# Patient Record
Sex: Female | Born: 1950 | Race: Black or African American | Hispanic: No | Marital: Single | Smoking: Never smoker
Health system: Southern US, Community
[De-identification: ages and names within clinical notes are randomized; demographics above are authoritative.]

## PROBLEM LIST (undated history)

## (undated) DIAGNOSIS — I350 Nonrheumatic aortic (valve) stenosis: Secondary | ICD-10-CM

## (undated) DIAGNOSIS — N183 Chronic kidney disease, stage 3 unspecified: Secondary | ICD-10-CM

## (undated) DIAGNOSIS — M8580 Other specified disorders of bone density and structure, unspecified site: Secondary | ICD-10-CM

## (undated) DIAGNOSIS — I10 Essential (primary) hypertension: Secondary | ICD-10-CM

## (undated) DIAGNOSIS — Z789 Other specified health status: Secondary | ICD-10-CM

## (undated) DIAGNOSIS — E785 Hyperlipidemia, unspecified: Secondary | ICD-10-CM

---

## 2021-03-26 DIAGNOSIS — I10 Essential (primary) hypertension: Secondary | ICD-10-CM | POA: Diagnosis present

## 2021-03-26 DIAGNOSIS — R7303 Prediabetes: Secondary | ICD-10-CM | POA: Insufficient documentation

## 2021-03-26 DIAGNOSIS — E785 Hyperlipidemia, unspecified: Secondary | ICD-10-CM | POA: Diagnosis present

## 2021-03-26 DIAGNOSIS — N1831 Chronic kidney disease, stage 3a: Secondary | ICD-10-CM | POA: Diagnosis present

## 2021-06-28 DIAGNOSIS — L309 Dermatitis, unspecified: Secondary | ICD-10-CM | POA: Insufficient documentation

## 2021-12-21 DIAGNOSIS — R011 Cardiac murmur, unspecified: Secondary | ICD-10-CM

## 2021-12-21 HISTORY — DX: Cardiac murmur, unspecified: R01.1

## 2021-12-23 ENCOUNTER — Other Ambulatory Visit: Payer: Self-pay | Admitting: Family Medicine

## 2021-12-23 DIAGNOSIS — Z780 Asymptomatic menopausal state: Secondary | ICD-10-CM

## 2021-12-23 DIAGNOSIS — Z13820 Encounter for screening for osteoporosis: Secondary | ICD-10-CM

## 2021-12-23 DIAGNOSIS — Z1231 Encounter for screening mammogram for malignant neoplasm of breast: Secondary | ICD-10-CM

## 2021-12-31 ENCOUNTER — Other Ambulatory Visit (HOSPITAL_COMMUNITY): Payer: Self-pay | Admitting: Family Medicine

## 2021-12-31 DIAGNOSIS — I10 Essential (primary) hypertension: Secondary | ICD-10-CM

## 2021-12-31 DIAGNOSIS — R011 Cardiac murmur, unspecified: Secondary | ICD-10-CM

## 2022-01-13 ENCOUNTER — Ambulatory Visit (HOSPITAL_COMMUNITY): Payer: Medicare Other | Attending: Cardiology

## 2022-01-13 DIAGNOSIS — I10 Essential (primary) hypertension: Secondary | ICD-10-CM | POA: Insufficient documentation

## 2022-01-13 DIAGNOSIS — R011 Cardiac murmur, unspecified: Secondary | ICD-10-CM | POA: Diagnosis present

## 2022-01-13 LAB — ECHOCARDIOGRAM COMPLETE
AR max vel: 0.88 cm2
AV Area VTI: 0.9 cm2
AV Area mean vel: 0.88 cm2
AV Mean grad: 34 mmHg
AV Peak grad: 51.9 mmHg
Ao pk vel: 3.6 m/s
Area-P 1/2: 2.54 cm2
P 1/2 time: 487 msec
S' Lateral: 2.3 cm

## 2022-01-14 DIAGNOSIS — I350 Nonrheumatic aortic (valve) stenosis: Secondary | ICD-10-CM

## 2022-01-18 ENCOUNTER — Ambulatory Visit
Admission: RE | Admit: 2022-01-18 | Discharge: 2022-01-18 | Disposition: A | Discharge status: Home / Self Care | Payer: Medicare Other | Source: Ambulatory Visit | Attending: Family Medicine | Admitting: Family Medicine

## 2022-01-18 DIAGNOSIS — Z13820 Encounter for screening for osteoporosis: Secondary | ICD-10-CM

## 2022-01-18 DIAGNOSIS — Z780 Asymptomatic menopausal state: Secondary | ICD-10-CM

## 2022-01-20 ENCOUNTER — Telehealth: Payer: Self-pay

## 2022-01-20 NOTE — Telephone Encounter (Signed)
NOTES SCANNED TO REFERRAL 

## 2022-01-24 NOTE — Progress Notes (Signed)
?Cardiology Office Note:   ? ?Date:  01/24/2022  ? ?ID:  Jeanette Martinez, DOB 1951-07-26, MRN [id number] ? ?PCP:  Lula Olszewski, MD ?  ?[facility] Providers ?Cardiologist:  None    ? ?Referring MD: Lula Olszewski, MD  ? ?No chief complaint on file. ?Severe AS ? ?History of Present Illness:   ? ?Jeanette Martinez is a 71 y.o. female with a hx of HTN, HLD, preDM, referral for murmur RUSB ? ?She was seen by her PCP. Noted to have RUSB murmur. Echo showed severe AS suspicious of  paradoxical low flow/low gradient AS. ? ?Her echo showed normal EF , AVA 0.9. mean gradient of 34 mmhg. SV 65; SVI  33  ? ?She notes numbness in her hands periodically. She is asymptomatic otherwise. She  rides her bike and goes up to 5 miles. No syncope. Blood pressures  147/80 , can be variable 116s. She is  retired but very active. Her daughter is here with her. She is a non smoker.   No prior cardiac history.   ? ? ?Current Medications: ?No outpatient medications have been marked as taking for the 01/25/22 encounter (Appointment) with Maisie Fus, MD.  ?  ? ?Allergies:   Patient has no allergy information on record.  ? ?Social History  ? ?Socioeconomic History  ? Marital status: Single  ?  Spouse name: Not on file  ? Number of children: Not on file  ? Years of education: Not on file  ? Highest education level: Not on file  ?Occupational History  ? Not on file  ?Tobacco Use  ? Smoking status: Not on file  ? Smokeless tobacco: Not on file  ?Substance and Sexual Activity  ? Alcohol use: Not on file  ? Drug use: Not on file  ? Sexual activity: Not on file  ?Other Topics Concern  ? Not on file  ?Social History Narrative  ? Not on file  ? ?Social Determinants of Health  ? ?Financial Resource Strain: Not on file  ?Food Insecurity: Not on file  ?Transportation Needs: Not on file  ?Physical Activity: Not on file  ?Stress: Not on file  ?Social Connections: Not on file  ?  ? ?Family History: ?The patient's no Premtaure CAD ? ?ROS:   ?Please see the  history of present illness.    ? All other systems reviewed and are negative. ? ?EKGs/Labs/Other Studies Reviewed:   ? ?The following studies were reviewed today: ? ? ?EKG:  EKG is  ordered today.  The ekg ordered today demonstrates  ? ?NSR, LVH, septal Q ? ?Recent Labs: ?No results found for requested labs within last 8760 hours.  ?Recent Lipid Panel ?No results found for: CHOL, TRIG, HDL, CHOLHDL, VLDL, LDLCALC, LDLDIRECT ? ? ?Risk Assessment/Calculations:   ?  ? ?    ? ?Physical Exam:   ? ?VS:   ?Vitals:  ? 01/25/22 0846  ?BP: 140/80  ?Pulse: 70  ?SpO2: 97%  ? ? ? ?Wt Readings from Last 3 Encounters:  ?No data found for Wt  ?  ? ?GEN:  Well nourished, well developed in no acute distress ?HEENT: Normal ?NECK: No JVD; No carotid bruits ?LYMPHATICS: No lymphadenopathy ?CARDIAC: RRR, 3/6 SEM RUSB, normal carotid upstroke, norubs, gallops ?RESPIRATORY:  Clear to auscultation without rales, wheezing or rhonchi  ?ABDOMEN: Soft, non-tender, non-distended ?MUSCULOSKELETAL:  No edema; No deformity  ?SKIN: Warm and dry ?NEUROLOGIC:  Alert and oriented x 3 ?PSYCHIATRIC:  Normal affect  ? ?ASSESSMENT:   ? ?  Paradoxical low flow/low gradient severe AS: Has normal EF with low SVI. AVA is <1. She is currently asymptomatic. Plan to re-assess for symptoms. We discussed if she develops LH, dizziness, or syncope to let me know. At which time will plan for a cardiac CT and referral to the structural heart team for consideration of TAVI.   ? ?PLAN:   ? ?In order of problems listed above: ? ?Follow up 3 months ? ?   ? ?   ? ? ?Medication Adjustments/Labs and Tests Ordered: ?Current medicines are reviewed at length with the patient today.  Concerns regarding medicines are outlined above.  ?No orders of the defined types were placed in this encounter. ? ?No orders of the defined types were placed in this encounter. ? ? ?There are no Patient Instructions on file for this visit.  ? ?Signed, ?Maisie Fus, MD  ?01/24/2022 2:37 PM    ?[facility] ?

## 2022-01-25 ENCOUNTER — Encounter: Payer: Self-pay | Admitting: Internal Medicine

## 2022-01-25 ENCOUNTER — Ambulatory Visit (INDEPENDENT_AMBULATORY_CARE_PROVIDER_SITE_OTHER): Payer: Medicare Other | Admitting: Internal Medicine

## 2022-01-25 VITALS — BP 140/80 | HR 70 | Ht 63.5 in | Wt 182.0 lb

## 2022-01-25 DIAGNOSIS — I350 Nonrheumatic aortic (valve) stenosis: Secondary | ICD-10-CM

## 2022-01-25 NOTE — Patient Instructions (Addendum)
Glad you are doing well. The plan for your aortic stenosis is to look out for symptoms of light headedness, dizziness or feeling faint. Please call and let us know if this occurs. If so will plan for further evaluation with a CT scan. Otherwise, enjoy your summer ! ? ? ?Medication Instructions:  ?Your physician recommends that you continue on your current medications as directed. Please refer to the Current Medication list given to you today. ? ?*If you need a refill on your cardiac medications before your next appointment, please call your pharmacy* ? ?Follow-Up: ?At [facility], you and your health needs are our priority.  As part of our continuing mission to provide you with exceptional heart care, we have created designated Provider Care Teams.  These Care Teams include your primary Cardiologist (physician) and Advanced Practice Providers (APPs -  Physician Assistants and Nurse Practitioners) who all work together to provide you with the care you need, when you need it. ? ?We recommend signing up for the patient portal called "MyChart".  Sign up information is provided on this After Visit Summary.  MyChart is used to connect with patients for Virtual Visits (Telemedicine).  Patients are able to view lab/test results, encounter notes, upcoming appointments, etc.  Non-urgent messages can be sent to your provider as well.   ?To learn more about what you can do with MyChart, go to [email or web address].   ? ?Your next appointment:   ?3 month(s) ? ?The format for your next appointment:   ?In Person ? ?Provider:   ?Dr. Wyline Mood  ? ?Important Information About Sugar ? ? ? ? ? ? ?

## 2022-04-28 ENCOUNTER — Encounter: Payer: Self-pay | Admitting: Internal Medicine

## 2022-04-28 ENCOUNTER — Ambulatory Visit (INDEPENDENT_AMBULATORY_CARE_PROVIDER_SITE_OTHER): Payer: Medicare Other | Admitting: Internal Medicine

## 2022-04-28 VITALS — BP 162/84 | HR 72 | Ht 63.5 in | Wt 183.2 lb

## 2022-04-28 DIAGNOSIS — I350 Nonrheumatic aortic (valve) stenosis: Secondary | ICD-10-CM | POA: Diagnosis not present

## 2022-04-28 NOTE — Patient Instructions (Signed)
Medication Instructions:  Your physician recommends that you continue on your current medications as directed. Please refer to the Current Medication list given to you today.  *If you need a refill on your cardiac medications before your next appointment, please call your pharmacy*   Follow-Up: At [facility], you and your health needs are our priority.  As part of our continuing mission to provide you with exceptional heart care, we have created designated Provider Care Teams.  These Care Teams include your primary Cardiologist (physician) and Advanced Practice Providers (APPs -  Physician Assistants and Nurse Practitioners) who all work together to provide you with the care you need, when you need it.  We recommend signing up for the patient portal called "MyChart".  Sign up information is provided on this After Visit Summary.  MyChart is used to connect with patients for Virtual Visits (Telemedicine).  Patients are able to view lab/test results, encounter notes, upcoming appointments, etc.  Non-urgent messages can be sent to your provider as well.   To learn more about what you can do with MyChart, go to [email or web address].    Your next appointment:   6 month(s)  The format for your next appointment:   In Person  Provider:   Carolan Clines, MD

## 2022-04-28 NOTE — Progress Notes (Signed)
Cardiology Office Note:    Date:  04/28/2022   ID:  Jeanette Martinez, DOB 1951/07/24, MRN [id number]  PCP:  Jeanette Olszewski, MD   [facility] Providers Cardiologist:  None     Referring MD: Jeanette Olszewski, MD   No chief complaint on file. Severe AS  History of Present Illness:    Jeanette Martinez is a 71 y.o. female with a hx of HTN, HLD, preDM, referral for murmur RUSB  She was seen by her PCP. Noted to have RUSB murmur. Echo showed severe AS suspicious of  paradoxical low flow/low gradient AS.  Her echo showed normal EF , AVA 0.9. mean gradient of 34 mmhg. SV 65; SVI  33   She notes numbness in her hands periodically. She is asymptomatic otherwise. She  rides her bike and goes up to 5 miles. No syncope. Blood pressures  147/80 , can be variable 116s. She is  retired but very active. Her daughter is here with her. She is a non smoker.   No prior cardiac history.    Interim Hx 19/10 Jeanette Martinez comes in today. She feels well today. She is asymptomatic. She saw her PCP recently.  She is still doing her exercises. She checks her blood pressures at home. It fluctuates. Her Bps at home ate 1teens to 130s SBP.   Current Medications: Current Meds  Medication Sig   atenolol (TENORMIN) 25 MG tablet Take 25 mg by mouth daily.   atorvastatin (LIPITOR) 10 MG tablet Take 10 mg by mouth daily.   lisinopril-hydrochlorothiazide (ZESTORETIC) 20-25 MG tablet    Triamcinolone Acetonide (TRIAMCINOLONE 0.1 % CREAM : EUCERIN) CREA      Allergies:   Patient has no allergy information on record.   Social History   Socioeconomic History   Marital status: Single    Spouse name: Not on file   Number of children: Not on file   Years of education: Not on file   Highest education level: Not on file  Occupational History   Not on file  Tobacco Use   Smoking status: Never   Smokeless tobacco: Never  Substance and Sexual Activity   Alcohol use: Not Currently   Drug use: Never   Sexual  activity: Not Currently  Other Topics Concern   Not on file  Social History Narrative   Not on file   Social Determinants of Health   Financial Resource Strain: Not on file  Food Insecurity: Not on file  Transportation Needs: Not on file  Physical Activity: Not on file  Stress: Not on file  Social Connections: Not on file     Family History: The patient's no Premtaure CAD  ROS:   Please see the history of present illness.     All other systems reviewed and are negative.  EKGs/Labs/Other Studies Reviewed:    The following studies were reviewed today:   EKG:  EKG is  ordered today.  The ekg ordered today demonstrates   NSR, LVH, septal Q  Recent Labs: No results found for requested labs within last 365 days.  Recent Lipid Panel No results found for: "CHOL", "TRIG", "HDL", "CHOLHDL", "VLDL", "LDLCALC", "LDLDIRECT"   Risk Assessment/Calculations:           Physical Exam:    VS:   Vitals:   04/28/22 0901  BP: (!) 162/84  Pulse: 72  SpO2: 99%      Wt Readings from Last 3 Encounters:  04/28/22 183 lb 3.2 oz (83.1 kg)  01/25/22 182  lb (82.6 kg)     GEN:  Well nourished, well developed in no acute distress HEENT: Normal NECK: No JVD; No carotid bruits LYMPHATICS: No lymphadenopathy CARDIAC: RRR, 3/6 SEM RUSB, normal carotid upstroke, norubs, gallops RESPIRATORY:  Clear to auscultation without rales, wheezing or rhonchi  ABDOMEN: Soft, non-tender, non-distended MUSCULOSKELETAL:  No edema; No deformity  SKIN: Warm and dry NEUROLOGIC:  Alert and oriented x 3 PSYCHIATRIC:  Normal affect   ASSESSMENT:    Paradoxical low flow/low gradient severe AS: Has normal EF with low SVI. AVA is <1. She is currently asymptomatic. Plan to re-assess for symptoms. We discussed if she develops LH, dizziness, or syncope to let me know. At which time will plan for a cardiac CT and referral to the structural heart team for consideration of TAVI.  No changes today  HTN: high  today. Typically in good control. We discussed if starting to trend upwards to let us know. Would change to arb/norvasc  PLAN:    In order of problems listed above:  Follow up 6 months         Medication Adjustments/Labs and Tests Ordered: Current medicines are reviewed at length with the patient today.  Concerns regarding medicines are outlined above.  No orders of the defined types were placed in this encounter.  No orders of the defined types were placed in this encounter.      Signed, Maisie Fus, MD  04/28/2022 9:40 AM    [facility]

## 2022-11-01 ENCOUNTER — Ambulatory Visit: Payer: Medicare Other | Attending: Internal Medicine | Admitting: Internal Medicine

## 2022-11-01 ENCOUNTER — Encounter: Payer: Self-pay | Admitting: Internal Medicine

## 2022-11-01 VITALS — BP 138/70 | HR 76 | Ht 64.0 in | Wt 181.6 lb

## 2022-11-01 DIAGNOSIS — I350 Nonrheumatic aortic (valve) stenosis: Secondary | ICD-10-CM | POA: Diagnosis not present

## 2022-11-01 NOTE — Patient Instructions (Signed)
    Follow-Up: At [facility], you and your health needs are our priority.  As part of our continuing mission to provide you with exceptional heart care, we have created designated Provider Care Teams.  These Care Teams include your primary Cardiologist (physician) and Advanced Practice Providers (APPs -  Physician Assistants and Nurse Practitioners) who all work together to provide you with the care you need, when you need it.  We recommend signing up for the patient portal called "MyChart".  Sign up information is provided on this After Visit Summary.  MyChart is used to connect with patients for Virtual Visits (Telemedicine).  Patients are able to view lab/test results, encounter notes, upcoming appointments, etc.  Non-urgent messages can be sent to your provider as well.   To learn more about what you can do with MyChart, go to [email or web address].    Your next appointment:   12 month(s)  Provider:   Phineas Inches MD

## 2022-11-01 NOTE — Progress Notes (Signed)
Cardiology Office Note:    Date:  11/01/2022   ID:  Jeanette Martinez, DOB Apr 09, 1951, MRN [id number]  PCP:  Jeanette Sidle, MD   [facility] Providers Cardiologist:  None     Referring MD: Jeanette Sidle, MD   No chief complaint on file. Severe AS  History of Present Illness:    Jeanette Martinez is a 72 y.o. female with a hx of HTN, HLD, preDM, referral for murmur RUSB  She was seen by her PCP. Noted to have RUSB murmur. Echo showed severe AS suspicious of  paradoxical low flow/low gradient AS.  Her echo showed normal EF , AVA 0.9. mean gradient of 34 mmhg. SV 65; SVI  33   She notes numbness in her hands periodically. She is asymptomatic otherwise. She  rides her bike and goes up to 5 miles. No syncope. Blood pressures  147/80 , can be variable 116s. She is  retired but very active. Her daughter is here with her. She is a non smoker.   No prior cardiac history.    Interim Hx 69/10 Jeanette Martinez comes in today. She feels well today. She is asymptomatic. She saw her PCP recently.  She is still doing her exercises. She checks her blood pressures at home. It fluctuates. Her Bps at home ate 1teens to 130s SBP.   Interim Hx 11/01/2022 BP in a good range today. She is doing well. She is doing exercises. She is doing cardio/ riding a bicycle. She is planning to lift weights  Current Medications: Current Outpatient Medications on File Prior to Visit  Medication Sig Dispense Refill   atenolol (TENORMIN) 25 MG tablet Take 25 mg by mouth daily.     atorvastatin (LIPITOR) 10 MG tablet Take 10 mg by mouth daily.     lisinopril-hydrochlorothiazide (ZESTORETIC) 20-25 MG tablet      Multiple Vitamin (MULTIVITAMIN) tablet Take 1 tablet by mouth daily.     Triamcinolone Acetonide (TRIAMCINOLONE 0.1 % CREAM : EUCERIN) CREA      No current facility-administered medications on file prior to visit.     Allergies:   Patient has no known allergies.   Social History   Socioeconomic History    Marital status: Single    Spouse name: Not on file   Number of children: Not on file   Years of education: Not on file   Highest education level: Not on file  Occupational History   Not on file  Tobacco Use   Smoking status: Never   Smokeless tobacco: Never  Substance and Sexual Activity   Alcohol use: Not Currently   Drug use: Never   Sexual activity: Not Currently  Other Topics Concern   Not on file  Social History Narrative   Not on file   Social Determinants of Health   Financial Resource Strain: Not on file  Food Insecurity: Not on file  Transportation Needs: Not on file  Physical Activity: Not on file  Stress: Not on file  Social Connections: Not on file     Family History: The patient's no Premtaure CAD  ROS:   Please see the history of present illness.     All other systems reviewed and are negative.  EKGs/Labs/Other Studies Reviewed:    The following studies were reviewed today:   EKG:  EKG is  ordered today.  The ekg ordered today demonstrates   NSR, LVH, septal Q  Recent Labs: No results found for requested labs within last 365 days.  Recent Lipid Panel  No results found for: "CHOL", "TRIG", "HDL", "CHOLHDL", "VLDL", "LDLCALC", "LDLDIRECT"   Risk Assessment/Calculations:           Physical Exam:    VS:   Vitals:   11/01/22 0920  BP: 138/70  Pulse: 76     Wt Readings from Last 3 Encounters:  11/01/22 181 lb 9.6 oz (82.4 kg)  04/28/22 183 lb 3.2 oz (83.1 kg)  01/25/22 182 lb (82.6 kg)     GEN:  Well nourished, well developed in no acute distress HEENT: Normal NECK: No JVD; No carotid bruits LYMPHATICS: No lymphadenopathy CARDIAC: RRR, 3/6 SEM RUSB, radiates to the carotid ,no rubs, gallops RESPIRATORY:  Clear to auscultation without rales, wheezing or rhonchi  ABDOMEN: Soft, non-tender, non-distended MUSCULOSKELETAL:  No edema; No deformity  SKIN: Warm and dry NEUROLOGIC:  Alert and oriented x 3 PSYCHIATRIC:  Normal affect    ASSESSMENT:    Paradoxical low flow/low gradient severe AS: Has normal EF with low SVI. AVA is <1. She is currently asymptomatic. Plan to re-assess for symptoms. We discussed if she develops LH, dizziness, or syncope to let me know. At which time will plan for a cardiac CT and referral to the structural heart team for consideration of TAVI.    HTN: high today. Typically in good control. We discussed if starting to trend upwards to let us know. Would change to arb/norvasc  PLAN:    In order of problems listed above:  Follow up 12 months         Medication Adjustments/Labs and Tests Ordered: Current medicines are reviewed at length with the patient today.  Concerns regarding medicines are outlined above.  Orders Placed This Encounter  Procedures   EKG 12-Lead   No orders of the defined types were placed in this encounter.      Signed, Janina Mayo, MD  11/01/2022 9:35 AM    [facility]

## 2023-10-26 NOTE — Progress Notes (Signed)
 Cardiology Office Note:    Date:  10/26/2023   ID:  Jeanette Martinez , DOB 06-14-1951, MRN [id number]  PCP:  Jeanette Llano, MD   [facility] Providers Cardiologist:  Jeanette Ronal BRAVO, MD     Referring MD: Jeanette Llano, MD   No chief complaint on file. Severe AS  History of Present Illness:    Jeanette Martinez  is a 73 y.o. female with a hx of HTN, HLD, preDM, referral for murmur RUSB  She was seen by her PCP. Noted to have RUSB murmur. Echo showed severe AS suspicious of  paradoxical low flow/low gradient AS.  Her echo showed normal EF , AVA 0.9. mean gradient of 34 mmhg. SV 65; SVI  33   She notes numbness in her hands periodically. She is asymptomatic otherwise. She  rides her bike and goes up to 5 miles. No syncope. Blood pressures  147/80 , can be variable 116s. She is  retired but very active. Her daughter is here with her. She is a non smoker.   No prior cardiac history.    Interim Hx 64/10 Jeanette Martinez  comes in today. She feels well today. She is asymptomatic. She saw her PCP recently.  She is still doing her exercises. She checks her blood pressures at home. It fluctuates. Her Bps at home ate 1teens to 130s SBP.   Interim Hx 11/01/2022 BP in a good range today. She is doing well. She is doing exercises. She is doing cardio/ riding a bicycle. She is planning to lift weights  Interim hx 10/27/2023 Jeanette Martinez  is here for follow-up. She denies SOB or CP. She is busy with her grandkids.   Current Medications: Current Outpatient Medications on File Prior to Visit  Medication Sig Dispense Refill   atenolol (TENORMIN) 25 MG tablet Take 25 mg by mouth daily.     atorvastatin (LIPITOR) 10 MG tablet Take 10 mg by mouth daily.     lisinopril-hydrochlorothiazide (ZESTORETIC) 20-25 MG tablet      Multiple Vitamin (MULTIVITAMIN) tablet Take 1 tablet by mouth daily.     Triamcinolone Acetonide (TRIAMCINOLONE 0.1 % CREAM : EUCERIN) CREA      No current facility-administered  medications on file prior to visit.     Allergies:   Patient has no known allergies.   Social History   Socioeconomic History   Marital status: Single    Spouse name: Not on file   Number of children: Not on file   Years of education: Not on file   Highest education level: Not on file  Occupational History   Not on file  Tobacco Use   Smoking status: Never   Smokeless tobacco: Never  Substance and Sexual Activity   Alcohol use: Not Currently   Drug use: Never   Sexual activity: Not Currently  Other Topics Concern   Not on file  Social History Narrative   Not on file   Social Drivers of Health   Financial Resource Strain: Not on file  Food Insecurity: Not on file  Transportation Needs: Not on file  Physical Activity: Not on file  Stress: Not on file  Social Connections: Unknown (02/01/2022)   [facility]   Social Network    Social Network: Not on file     Family History: The patient's no Premtaure CAD  ROS:   Please see the history of present illness.     All other systems reviewed and are negative.  EKGs/Labs/Other Studies Reviewed:  The following studies were reviewed today:   EKG:  EKG is  ordered today.  The ekg ordered today demonstrates   NSR, LVH, septal Q -Unchanged likely lead placement   Recent Labs: No results found for requested labs within last 365 days.  Recent Lipid Panel No results found for: CHOL, TRIG, HDL, CHOLHDL, VLDL, LDLCALC, LDLDIRECT   Risk Assessment/Calculations:           Physical Exam:    VS:   Vitals:   10/27/23 0909  BP: 130/62  Pulse: 63  SpO2: 96%    Wt Readings from Last 3 Encounters:  11/01/22 181 lb 9.6 oz (82.4 kg)  04/28/22 183 lb 3.2 oz (83.1 kg)  01/25/22 182 lb (82.6 kg)     GEN:  Well nourished, well developed in no acute distress HEENT: Normal NECK: No JVD CARDIAC: RRR, 3/6 SEM RUSB, radiates to the carotid ,no rubs, gallops RESPIRATORY:   Clear to auscultation without rales, wheezing or rhonchi  ABDOMEN: Soft, non-tender, non-distended MUSCULOSKELETAL:  No edema; No deformity  SKIN: Warm and dry NEUROLOGIC:  Alert and oriented x 3 PSYCHIATRIC:  Normal affect   ASSESSMENT:    Paradoxical low flow/low gradient severe AS: Has normal EF with low SVI. AVA is <1. She is currently asymptomatic. Plan to re-assess for symptoms. We discussed if she develops LH, dizziness, or syncope to let me know. At which time will plan for a cardiac CT/cath and referral to the structural heart team for consideration of TAVI.    HTN: well controlled. Continue current regimen, she's been on same regimen for many years.  She would prefer holistic approach if possible.  She also worried about her kidney function.  Will get labs today, but we discussed that keeping her blood pressure well-controlled will help protect her kidneys.  PLAN:    In order of problems listed above:  Repeat an echocardiogram BMET Follow up in 6 months with an APP        Medication Adjustments/Labs and Tests Ordered: Current medicines are reviewed at length with the patient today.  Concerns regarding medicines are outlined above.  No orders of the defined types were placed in this encounter.  No orders of the defined types were placed in this encounter.      Signed, Jeanette Ronal BRAVO, MD  10/26/2023 2:52 PM    [facility]

## 2023-10-27 ENCOUNTER — Encounter: Payer: Self-pay | Admitting: Internal Medicine

## 2023-10-27 ENCOUNTER — Ambulatory Visit: Payer: Medicare Other | Attending: Internal Medicine | Admitting: Internal Medicine

## 2023-10-27 VITALS — BP 130/62 | HR 63 | Ht 65.5 in | Wt 183.2 lb

## 2023-10-27 DIAGNOSIS — I350 Nonrheumatic aortic (valve) stenosis: Secondary | ICD-10-CM | POA: Insufficient documentation

## 2023-10-27 LAB — BASIC METABOLIC PANEL
BUN/Creatinine Ratio: 19 (ref 12–28)
BUN: 27 mg/dL (ref 8–27)
CO2: 22 mmol/L (ref 20–29)
Calcium: 9.4 mg/dL (ref 8.7–10.3)
Chloride: 100 mmol/L (ref 96–106)
Creatinine, Ser: 1.4 mg/dL — ABNORMAL HIGH (ref 0.57–1.00)
Glucose: 128 mg/dL — ABNORMAL HIGH (ref 70–99)
Potassium: 4.7 mmol/L (ref 3.5–5.2)
Sodium: 137 mmol/L (ref 134–144)
eGFR: 40 mL/min/{1.73_m2} — ABNORMAL LOW (ref >59)

## 2023-10-27 NOTE — Patient Instructions (Signed)
 Medication Instructions:  No changes  *If you need a refill on your cardiac medications before your next appointment, please call your pharmacy*   Lab Work: BMET If you have labs (blood work) drawn today and your tests are completely normal, you will receive your results only by: MyChart Message (if you have MyChart) OR A paper copy in the mail If you have any lab test that is abnormal or we need to change your treatment, we will call you to review the results.   Testing/Procedures: Echo next available  Your physician has requested that you have an echocardiogram. Echocardiography is a painless test that uses sound waves to create images of your heart. It provides your doctor with information about the size and shape of your heart and how well your heart's chambers and valves are working. This procedure takes approximately one hour. There are no restrictions for this procedure. Please do NOT wear cologne, perfume, aftershave, or lotions (deodorant is allowed). Please arrive 15 minutes prior to your appointment time.  Please note: We ask at that you not bring children with you during ultrasound (echo/ vascular) testing. Due to room size and safety concerns, children are not allowed in the ultrasound rooms during exams. Our front office staff cannot provide observation of children in our lobby area while testing is being conducted. An adult accompanying a patient to their appointment will only be allowed in the ultrasound room at the discretion of the ultrasound technician under special circumstances. We apologize for any inconvenience.    Follow-Up: At [facility], you and your health needs are our priority.  As part of our continuing mission to provide you with exceptional heart care, we have created designated Provider Care Teams.  These Care Teams include your primary Cardiologist (physician) and Advanced Practice Providers (APPs -  Physician Assistants and Nurse Practitioners) who  all work together to provide you with the care you need, when you need it.  We recommend signing up for the patient portal called MyChart.  Sign up information is provided on this After Visit Summary.  MyChart is used to connect with patients for Virtual Visits (Telemedicine).  Patients are able to view lab/test results, encounter notes, upcoming appointments, etc.  Non-urgent messages can be sent to your provider as well.   To learn more about what you can do with MyChart, go to [email or web address].    Your next appointment:   6 month(s)  Provider:   With any APP   Other Instructions

## 2023-11-23 ENCOUNTER — Ambulatory Visit (HOSPITAL_COMMUNITY): Payer: Medicare Other | Attending: Internal Medicine

## 2023-11-23 DIAGNOSIS — I350 Nonrheumatic aortic (valve) stenosis: Secondary | ICD-10-CM | POA: Diagnosis present

## 2023-11-23 LAB — ECHOCARDIOGRAM COMPLETE
AR max vel: 0.98 cm2
AV Area VTI: 1.02 cm2
AV Area mean vel: 0.97 cm2
AV Mean grad: 33.6 mmHg
AV Peak grad: 59 mmHg
Ao pk vel: 3.84 m/s
Area-P 1/2: 3.27 cm2
P 1/2 time: 505 ms
S' Lateral: 2.1 cm

## 2023-11-27 ENCOUNTER — Encounter: Payer: Self-pay | Admitting: *Deleted

## 2024-10-17 NOTE — Progress Notes (Signed)
"   °  °  Cardiology Office Note Date:  10/18/2024  ID:  Jeanette Martinez , DOB [birth year], MRN [id number] PCP:  Rik Glinda DASEN, FNP  Cardiologist:  Joelle VEAR Ren Donley, MD  Chief Complaint  Patient presents with   severe aortic stenosis     Problems Severe AS TTE 3/25: 65-70%, mod LVH, G1DD, mild LAE, Severe AS (3.9 m/sec, MG 34 mmHg, AVA 1.02 cm^2, mod AI M: AE25, AN10, LL-HTZ 20-25  Visits  2/25: Asymptomatic; TTE 1/26: Admit for symptomatic severe AS    Discussed the use of AI scribe software for clinical note transcription with the patient, who gave verbal consent to proceed.  History of Present Illness Jeanette Martinez  is a 74 year old female with severe aortic stenosis who presents with episodes of fainting and dizziness. She is accompanied by her daughter. She was previously under the care of Dr. Alvan for her condition. She has been experiencing episodes of faintness and dizziness over the past week, particularly during exertion such as walking up stairs or moving around in a store. She describes a sensation of a wave coming over her, leading to lightheadedness and the need to sit down and take deep breaths to recover. These episodes have occurred multiple times this week, with the most severe incident resulting in her passing out at a store entrance. No chest pain is reported, but there is a slight shortness of breath accompanying these episodes, especially during physical activity. Her past medical history includes a heart valve issue for which she was being followed by a cardiologist, with a follow-up appointment missed in August. She was due for a follow-up in August but missed the appointment. She has not experienced similar fainting episodes before this week. She is a [employer] and does not accept blood transfusions, which is relevant to her medical management. She has a durable power of attorney and has communicated her wishes regarding medical treatment to her daughter,  who is aware of her medical history and current situation.   ROS: Please see the history of present illness. All other systems are reviewed and negative.    PHYSICAL EXAM: VS:  BP (!) 166/88 (BP Location: Right Arm, Patient Position: Sitting, Cuff Size: Normal)   Pulse 94   Ht 5' 5 (1.651 m)   Wt 183 lb 8 oz (83.2 kg)   SpO2 92%   BMI 30.54 kg/m  , BMI Body mass index is 30.54 kg/m. GEN: Well nourished, well developed, in no acute distress HEENT: normal Neck: no JVD, carotid bruits, or masses Cardiac: RRR with 4/6 SEM loudest in LUSB radiating to carotids; no rubs, or gallops,no edema  Respiratory:  CTAB bilaterally, normal work of breathing GI: soft, nontender, nondistended, + BS Extremities: No LE edema Skin: warm and dry, no rash Neuro:  Strength and sensation are intact  Recent Labs: Reviewed  Studies: Reviewed Assessment & Plan  Severe aortic stenosis with syncope Has had recurrent syncopal events this week and including this morning in the clinic building, concerning for symptomatic severe AS - Admitted to hospital for evaluation and management. - Coordinate with cardiology and surgery for evaluation of open heart surgery versus TAVR. - Inform cardiologist and surgeon of her refusal of blood transfusions and discuss alternatives. - Arrange anesthesiology consultation for perioperative management considering refusal of blood transfusions. - Scheduled follow-up in three months, subject to hospital evaluation outcomes.   Signed, Joelle VEAR Ren Donley, MD  10/18/2024 10:52 AM    [facility] "

## 2024-10-18 ENCOUNTER — Encounter (HOSPITAL_COMMUNITY): Payer: Self-pay

## 2024-10-18 ENCOUNTER — Inpatient Hospital Stay (HOSPITAL_COMMUNITY)
Admission: EM | Admit: 2024-10-18 | Discharge: 2024-10-22 | DRG: 164 | Disposition: A | Source: Home / Self Care | Attending: Internal Medicine | Admitting: Internal Medicine

## 2024-10-18 ENCOUNTER — Emergency Department (HOSPITAL_COMMUNITY)

## 2024-10-18 ENCOUNTER — Ambulatory Visit

## 2024-10-18 ENCOUNTER — Telehealth: Payer: Self-pay

## 2024-10-18 ENCOUNTER — Other Ambulatory Visit: Payer: Self-pay

## 2024-10-18 VITALS — BP 166/88 | HR 94 | Ht 65.0 in | Wt 183.5 lb

## 2024-10-18 DIAGNOSIS — R7989 Other specified abnormal findings of blood chemistry: Secondary | ICD-10-CM

## 2024-10-18 DIAGNOSIS — N1831 Chronic kidney disease, stage 3a: Secondary | ICD-10-CM | POA: Diagnosis not present

## 2024-10-18 DIAGNOSIS — I82401 Acute embolism and thrombosis of unspecified deep veins of right lower extremity: Secondary | ICD-10-CM

## 2024-10-18 DIAGNOSIS — I080 Rheumatic disorders of both mitral and aortic valves: Secondary | ICD-10-CM | POA: Diagnosis present

## 2024-10-18 DIAGNOSIS — I352 Nonrheumatic aortic (valve) stenosis with insufficiency: Secondary | ICD-10-CM | POA: Diagnosis present

## 2024-10-18 DIAGNOSIS — I2699 Other pulmonary embolism without acute cor pulmonale: Principal | ICD-10-CM | POA: Diagnosis present

## 2024-10-18 DIAGNOSIS — M419 Scoliosis, unspecified: Secondary | ICD-10-CM | POA: Diagnosis present

## 2024-10-18 DIAGNOSIS — I10 Essential (primary) hypertension: Secondary | ICD-10-CM | POA: Insufficient documentation

## 2024-10-18 DIAGNOSIS — G4733 Obstructive sleep apnea (adult) (pediatric): Secondary | ICD-10-CM | POA: Diagnosis present

## 2024-10-18 DIAGNOSIS — I700 Atherosclerosis of aorta: Secondary | ICD-10-CM | POA: Diagnosis present

## 2024-10-18 DIAGNOSIS — Z79899 Other long term (current) drug therapy: Secondary | ICD-10-CM

## 2024-10-18 DIAGNOSIS — I350 Nonrheumatic aortic (valve) stenosis: Secondary | ICD-10-CM | POA: Insufficient documentation

## 2024-10-18 DIAGNOSIS — E785 Hyperlipidemia, unspecified: Secondary | ICD-10-CM | POA: Insufficient documentation

## 2024-10-18 DIAGNOSIS — Z531 Procedure and treatment not carried out because of patient's decision for reasons of belief and group pressure: Secondary | ICD-10-CM | POA: Diagnosis not present

## 2024-10-18 DIAGNOSIS — I82451 Acute embolism and thrombosis of right peroneal vein: Secondary | ICD-10-CM | POA: Diagnosis present

## 2024-10-18 DIAGNOSIS — I351 Nonrheumatic aortic (valve) insufficiency: Secondary | ICD-10-CM

## 2024-10-18 DIAGNOSIS — I2724 Chronic thromboembolic pulmonary hypertension: Secondary | ICD-10-CM | POA: Diagnosis present

## 2024-10-18 DIAGNOSIS — I959 Hypotension, unspecified: Secondary | ICD-10-CM | POA: Diagnosis not present

## 2024-10-18 DIAGNOSIS — I1310 Hypertensive heart and chronic kidney disease without heart failure, with stage 1 through stage 4 chronic kidney disease, or unspecified chronic kidney disease: Secondary | ICD-10-CM | POA: Diagnosis present

## 2024-10-18 DIAGNOSIS — R55 Syncope and collapse: Principal | ICD-10-CM

## 2024-10-18 DIAGNOSIS — R918 Other nonspecific abnormal finding of lung field: Secondary | ICD-10-CM | POA: Diagnosis present

## 2024-10-18 HISTORY — DX: Other specified health status: Z78.9

## 2024-10-18 HISTORY — DX: Nonrheumatic aortic (valve) stenosis: I35.0

## 2024-10-18 HISTORY — DX: Other specified disorders of bone density and structure, unspecified site: M85.80

## 2024-10-18 HISTORY — DX: Hyperlipidemia, unspecified: E78.5

## 2024-10-18 HISTORY — DX: Chronic kidney disease, stage 3 unspecified: N18.30

## 2024-10-18 HISTORY — DX: Essential (primary) hypertension: I10

## 2024-10-18 LAB — BASIC METABOLIC PANEL WITH GFR
Anion gap: 14 (ref 5–15)
BUN: 21 mg/dL (ref 8–23)
CO2: 24 mmol/L (ref 22–32)
Calcium: 9.6 mg/dL (ref 8.9–10.3)
Chloride: 98 mmol/L (ref 98–111)
Creatinine, Ser: 1.25 mg/dL — ABNORMAL HIGH (ref 0.44–1.00)
GFR, Estimated: 46 mL/min — ABNORMAL LOW
Glucose, Bld: 128 mg/dL — ABNORMAL HIGH (ref 70–99)
Potassium: 4.8 mmol/L (ref 3.5–5.1)
Sodium: 136 mmol/L (ref 135–145)

## 2024-10-18 LAB — CBC
HCT: 42.5 % (ref 36.0–46.0)
Hemoglobin: 13.6 g/dL (ref 12.0–15.0)
MCH: 29.1 pg (ref 26.0–34.0)
MCHC: 32 g/dL (ref 30.0–36.0)
MCV: 90.8 fL (ref 80.0–100.0)
Platelets: 260 10*3/uL (ref 150–400)
RBC: 4.68 MIL/uL (ref 3.87–5.11)
RDW: 15 % (ref 11.5–15.5)
WBC: 6.1 10*3/uL (ref 4.0–10.5)
nRBC: 0 % (ref 0.0–0.2)

## 2024-10-18 LAB — TROPONIN T, HIGH SENSITIVITY
Troponin T High Sensitivity: 54 ng/L — ABNORMAL HIGH (ref 0–19)
Troponin T High Sensitivity: 50 ng/L — ABNORMAL HIGH (ref 0–19)

## 2024-10-18 MED ORDER — ATORVASTATIN CALCIUM 10 MG PO TABS
10.0000 mg | ORAL_TABLET | Freq: Every day | ORAL | Status: DC
  Administered 2024-10-19 – 2024-10-22 (×3): 10 mg via ORAL
  Filled 2024-10-18 (×3): qty 1

## 2024-10-18 MED ORDER — POLYETHYLENE GLYCOL 3350 17 G PO PACK: 17.0000 g | PACK | Freq: Every day | ORAL | Status: DC | PRN

## 2024-10-18 MED ORDER — ATENOLOL 25 MG PO TABS
25.0000 mg | ORAL_TABLET | Freq: Every day | ORAL | Status: DC
  Administered 2024-10-19: 25 mg via ORAL
  Filled 2024-10-18 (×2): qty 1

## 2024-10-18 MED ORDER — SODIUM CHLORIDE 0.9% FLUSH
3.0000 mL | Freq: Two times a day (BID) | INTRAVENOUS | Status: DC
  Administered 2024-10-19 – 2024-10-22 (×6): 3 mL via INTRAVENOUS

## 2024-10-18 MED ORDER — LISINOPRIL 20 MG PO TABS: 20.0000 mg | ORAL_TABLET | Freq: Every day | ORAL | Status: DC

## 2024-10-18 MED ORDER — ACETAMINOPHEN 325 MG PO TABS: 650.0000 mg | ORAL_TABLET | Freq: Four times a day (QID) | ORAL | Status: DC | PRN

## 2024-10-18 MED ORDER — HYDROCHLOROTHIAZIDE 25 MG PO TABS: 25.0000 mg | ORAL_TABLET | Freq: Every day | ORAL | Status: DC

## 2024-10-18 MED ORDER — LISINOPRIL-HYDROCHLOROTHIAZIDE 20-25 MG PO TABS: 1.0000 | ORAL_TABLET | Freq: Every day | ORAL | Status: DC

## 2024-10-18 MED ORDER — ACETAMINOPHEN 650 MG RE SUPP: 650.0000 mg | Freq: Four times a day (QID) | RECTAL | Status: DC | PRN

## 2024-10-18 NOTE — ED Provider Notes (Cosign Needed Addendum)
" [place] [facility] Provider Note   CSN: [id number] Arrival date & time: 10/18/24  1131     Patient presents with: Shortness of Breath, Dizziness, and Loss of Consciousness   Avalin Martinez  is a 74 y.o. female with a past medical history notable for severe aortic stenosis who presents today for evaluation of syncopal episode.  Patient has an echo from 6 months ago that showed severe aortic stenosis however at that time was largely asymptomatic.  Starting approximately a week ago patient has had worsening exertional lightheadedness and presyncopal episodes without any frank syncope.  Patient describes that she has to climb up a set of stairs going into her house and more recently has had significant lightheadedness and near syncope when doing so.  Was seeing cardiology today and from walking to the clinic from the parking lot became lightheaded and had a syncopal episode that was witnessed by family.  Brief loss of consciousness without any seizure-like activity.  No significant trauma associated with this.  Was seen by cardiology instructed to come to the hospital for further evaluation.  Patient reports that she is asymptomatic at this point in time.  Has no ongoing chest pain, shortness of breath, lightheadedness or dizziness.   Shortness of Breath Associated symptoms: syncope   Dizziness Associated symptoms: shortness of breath and syncope   Loss of Consciousness Associated symptoms: dizziness and shortness of breath        Prior to Admission medications  Medication Sig Start Date End Date Taking? Authorizing Provider  atenolol  (TENORMIN ) 25 MG tablet Take 25 mg by mouth daily. 10/25/21   [provider]  atorvastatin  (LIPITOR) 10 MG tablet Take 10 mg by mouth daily. 10/25/21   [provider]  lisinopril -hydrochlorothiazide  (ZESTORETIC ) 20-25 MG tablet  09/21/11   [provider]  Multiple Vitamin (MULTIVITAMIN) tablet Take  1 tablet by mouth daily.    [provider]    Allergies: Patient has no known allergies.    Review of Systems  Respiratory:  Positive for shortness of breath.   Cardiovascular:  Positive for syncope.  Neurological:  Positive for dizziness.    Updated Vital Signs BP (!) 165/94   Pulse 83   Temp 97.9 F (36.6 C)   Resp 20   Ht 5' 5 (1.651 m)   Wt 83.2 kg   SpO2 94%   BMI 30.54 kg/m   Physical Exam Constitutional:      Appearance: She is well-developed.  HENT:     Head: Normocephalic.     Mouth/Throat:     Mouth: Mucous membranes are moist.  Eyes:     Pupils: Pupils are equal, round, and reactive to light.  Cardiovascular:     Rate and Rhythm: Normal rate and regular rhythm.     Heart sounds: Murmur heard.  Pulmonary:     Effort: Pulmonary effort is normal.     Breath sounds: Normal breath sounds. No decreased breath sounds.  Abdominal:     Palpations: Abdomen is soft.  Musculoskeletal:        General: Normal range of motion.     Cervical back: Normal range of motion.  Skin:    General: Skin is warm.     Capillary Refill: Capillary refill takes less than 2 seconds.  Neurological:     General: No focal deficit present.     Mental Status: She is alert.     Cranial Nerves: No cranial nerve deficit.  Psychiatric:  Mood and Affect: Mood normal.     (all labs ordered are listed, but only abnormal results are displayed) Labs Reviewed  BASIC METABOLIC PANEL WITH GFR - Abnormal; Notable for the following components:      Result Value   Glucose, Bld 128 (*)    Creatinine, Ser 1.25 (*)    GFR, Estimated 46 (*)    All other components within normal limits  TROPONIN T, HIGH SENSITIVITY - Abnormal; Notable for the following components:   Troponin T High Sensitivity 54 (*)    All other components within normal limits  TROPONIN T, HIGH SENSITIVITY - Abnormal; Notable for the following components:   Troponin T High Sensitivity 50 (*)    All other  components within normal limits  CBC    EKG: EKG Interpretation Date/Time:  Friday October 18 2024 12:06:52 EST Ventricular Rate:  83 PR Interval:  148 QRS Duration:  80 QT Interval:  378 QTC Calculation: 444 R Axis:   -7  Text Interpretation: Normal sinus rhythm Septal infarct , age undetermined T wave abnormality, consider anterior ischemia Abnormal ECG When compared with ECG of 27-Oct-2023 09:14, twave inversion in v3 is new from first prior ekg of 27 October 2023 Confirmed by Levander Houston [phone]) on 10/18/2024 2:23:20 PM  Radiology: DG Chest 2 View Result Date: 10/18/2024 CLINICAL DATA:  Chest pain. EXAM: CHEST - 2 VIEW COMPARISON:  None Available. FINDINGS: S-shaped scoliosis in the thoracolumbar spine. Lungs are clear. Heart size is within normal limits. Trachea is midline. No large pleural effusions. Negative for a pneumothorax. IMPRESSION: 1. No active cardiopulmonary disease. 2. Scoliosis. Electronically Signed   By: Juliene Balder M.D.   On: 10/18/2024 12:57    Procedures   Medications Ordered in the ED - No data to display                                Medical Decision Making Amount and/or Complexity of Data Reviewed Labs: ordered. Radiology: ordered.  Risk Decision regarding hospitalization.   Patient is a 74 year old female who presents today for evaluation of syncope in the setting of known severe aortic stenosis.  On initial assessment patient was noted to be hemodynamically stable and afebrile.  On bedside assessment patient was noted be resting comfortably without acute distress.  Patient does have a significant murmur heard on auscultation however otherwise without any acute findings on physical examination.  At the time of my evaluation patient already had laboratory assessment that did not show any acute abnormalities.  Patient did have a slightly elevated troponin at 54 that subsequent downtrended to 50.  I did speak to cardiology regarding this patient given  her known history and they recommended admission at this time.  Will do further evaluation in the inpatient setting and consideration for potential TAVR versus open heart valve repair.  Patient was ultimately mated to the hospital service for further management.  I do feel that her symptoms are related to symptomatic aortic stenosis.  I have lower concerns at this time for ACS.  Unlikely to be pulmonary embolism or acute aortic pathology based on description of symptoms and physical examination.   Final diagnoses:  Syncope and collapse  Aortic valve stenosis, etiology of cardiac valve disease unspecified    ED Discharge Orders     None          Laurita Sieving, MD 10/18/24 JERILYN    Laurita Sieving, MD 10/18/24  1957 ° °"

## 2024-10-18 NOTE — ED Provider Triage Note (Signed)
 Emergency Medicine Provider Triage Evaluation Note  Jeanette Martinez  , Martinez 74 y.o. female  was evaluated in triage.  Pt complains of syncope. Reports that since Monday she has had dyspnea on exertion with lightheadedness. Reports that she went to her audiologist for an appointment and had Martinez syncopal episode walking into the office.  She denies any head strike or any other injuries from this.  Reports that her cardiologist saw her and is concerned about her severe aortic stenosis contributing to her symptoms and sent her here for cardiology evaluation for potential TAVR.  Patient denies ever having chest pain, reports her symptoms are only present with exertion and resolves with rest.  Currently feels asymptomatic.  Review of Systems  Positive:  Negative:   Physical Exam  BP (!) 165/94   Pulse 83   Temp 97.9 F (36.6 C)   Resp 20   Ht 5' 5 (1.651 m)   Wt 83.2 kg   SpO2 94%   BMI 30.54 kg/m  Gen:   Awake, no distress   Resp:  Normal effort  MSK:   Moves extremities without difficulty  Other:  Systolic ejection murmur present  Medical Decision Making  Medically screening exam initiated at 1:00 PM.  Appropriate orders placed.  Jeanette Martinez  was informed that the remainder of the evaluation will be completed by another provider, this initial triage assessment does not replace that evaluation, and the importance of remaining in the ED until their evaluation is complete.  Work-up initiated   Jeanette Lehr A, PA-C 10/18/24 1302

## 2024-10-18 NOTE — Telephone Encounter (Signed)
 Hi. Pt wanted to drop off ppwk showing US  Aorta Screening for AAA with [facility]. I've left it in your mailbox. Thank you.

## 2024-10-18 NOTE — Progress Notes (Signed)
 Patient seen in office today by Dr Ren Ny. Severe AS with multiple pre syncopal events with exertion starting this week No chest pain or palpitations. ECG SR with LVH no AV block. TTE 11/23/23 with severe AS AVA 0.97, mean gradient 33.6 peak 59 mmHg DVI 0.27. Normal EF. Currently stable with no symptoms. 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 Lives independently. Has her two grand daughters in room with her. Discussed not being able to drive until valve issues sorted  No distress Lungs clear  AS murmur mid/late peaking S2 preserved Abdomen benign  No edema  CXR NAD scoliosis  Labs troponin 50 Hct 42.5 K 4.8 Cr 1.25   Will check d dimer to r/o PE Right /Left cath Monday risks discussed Orders done and on board. Hydrate evening before  Update Echo Can try to do TAVR CT Tuesday and get consults with structural done and CVTS while here. Hopefully can complete full w/u and D/c Tuesday   Shared Decision Making/Informed Consent The risks [stroke (1 in 1000), death (1 in 1000), kidney failure [usually temporary] (1 in 500), bleeding (1 in 200), allergic reaction [possibly serious] (1 in 200)], benefits (diagnostic support and management of coronary artery disease) and alternatives of a cardiac catheterization were discussed in detail with Jeanette Martinez and she is willing to proceed.   Jeanette Emmer MD [facility]

## 2024-10-18 NOTE — ED Triage Notes (Signed)
 Pt reports feeling dizzy and short of breath. Pt reports that today when she was coming to got to the cardiologist she had a syncope episode. Pt denies pain. Pt reports that she feels fine now. Pt reports that she has this feeling sometimes when walking upstairs. But it passes. Pt reports a history of aortic stenosis.

## 2024-10-18 NOTE — ED Triage Notes (Signed)
 Pt here with c/o [facility] and dizziness that started on Monday. Reports she made an appt with her cardiologist but passed out in the waiting room. Denies falling or LOC, was able to sit down prior to passing out.

## 2024-10-18 NOTE — Patient Instructions (Addendum)
 Medication Instructions:  NO CHANGES *If you need a refill on your cardiac medications before your next appointment, please call your pharmacy*  Lab Work: NO LABS If you have labs (blood work) drawn today and your tests are completely normal, you will receive your results only by: MyChart Message (if you have MyChart) OR A paper copy in the mail If you have any lab test that is abnormal or we need to change your treatment, we will call you to review the results.  Testing/Procedures: NO TESTING  Follow-Up: At [facility], you and your health needs are our priority.  As part of our continuing mission to provide you with exceptional heart care, our providers are all part of one team.  This team includes your primary Cardiologist (physician) and Advanced Practice Providers or APPs (Physician Assistants and Nurse Practitioners) who all work together to provide you with the care you need, when you need it.  Your next appointment:   FOLLOW UP WILL BE BASED OFF HOSPITAL DISCHARGE    Other Instructions  DR. REN IS RECOMMENDING DIRECT ADMIT TO THE HOSPITAL. PLEASE REPORT TO EMERGENCY ROOM, THEY HAVE BEEN INFORMED THAT YOU ARE COMING

## 2024-10-19 ENCOUNTER — Inpatient Hospital Stay (HOSPITAL_COMMUNITY)

## 2024-10-19 DIAGNOSIS — R7989 Other specified abnormal findings of blood chemistry: Secondary | ICD-10-CM

## 2024-10-19 DIAGNOSIS — I350 Nonrheumatic aortic (valve) stenosis: Secondary | ICD-10-CM

## 2024-10-19 DIAGNOSIS — I10 Essential (primary) hypertension: Secondary | ICD-10-CM | POA: Diagnosis not present

## 2024-10-19 DIAGNOSIS — I2699 Other pulmonary embolism without acute cor pulmonale: Secondary | ICD-10-CM

## 2024-10-19 DIAGNOSIS — R911 Solitary pulmonary nodule: Secondary | ICD-10-CM | POA: Diagnosis not present

## 2024-10-19 DIAGNOSIS — N1831 Chronic kidney disease, stage 3a: Secondary | ICD-10-CM | POA: Diagnosis not present

## 2024-10-19 LAB — COMPREHENSIVE METABOLIC PANEL WITH GFR
ALT: 78 U/L — ABNORMAL HIGH (ref 0–44)
AST: 45 U/L — ABNORMAL HIGH (ref 15–41)
Albumin: 3.6 g/dL (ref 3.5–5.0)
Alkaline Phosphatase: 48 U/L (ref 38–126)
Anion gap: 11 (ref 5–15)
BUN: 23 mg/dL (ref 8–23)
CO2: 26 mmol/L (ref 22–32)
Calcium: 9.1 mg/dL (ref 8.9–10.3)
Chloride: 102 mmol/L (ref 98–111)
Creatinine, Ser: 1.33 mg/dL — ABNORMAL HIGH (ref 0.44–1.00)
GFR, Estimated: 43 mL/min — ABNORMAL LOW
Glucose, Bld: 81 mg/dL (ref 70–99)
Potassium: 4.4 mmol/L (ref 3.5–5.1)
Sodium: 139 mmol/L (ref 135–145)
Total Bilirubin: 0.5 mg/dL (ref 0.0–1.2)
Total Protein: 7 g/dL (ref 6.5–8.1)

## 2024-10-19 LAB — MAGNESIUM: Magnesium: 1.7 mg/dL (ref 1.7–2.4)

## 2024-10-19 LAB — ECHOCARDIOGRAM COMPLETE
AR max vel: 0.73 cm2
AV Area VTI: 0.77 cm2
AV Area mean vel: 0.77 cm2
AV Mean grad: 38 mmHg
AV Peak grad: 63.4 mmHg
Ao pk vel: 3.98 m/s
Area-P 1/2: 3.51 cm2
Height: 65 in
MV VTI: 2.27 cm2
S' Lateral: 2.1 cm
Weight: 2838.4 [oz_av]

## 2024-10-19 LAB — CBC
HCT: 37.2 % (ref 36.0–46.0)
Hemoglobin: 12.2 g/dL (ref 12.0–15.0)
MCH: 29.3 pg (ref 26.0–34.0)
MCHC: 32.8 g/dL (ref 30.0–36.0)
MCV: 89.4 fL (ref 80.0–100.0)
Platelets: 211 10*3/uL (ref 150–400)
RBC: 4.16 MIL/uL (ref 3.87–5.11)
RDW: 15 % (ref 11.5–15.5)
WBC: 6.5 10*3/uL (ref 4.0–10.5)
nRBC: 0 % (ref 0.0–0.2)

## 2024-10-19 LAB — LACTIC ACID, PLASMA
Lactic Acid, Venous: 2.5 mmol/L (ref 0.5–1.9)
Lactic Acid, Venous: 2 mmol/L (ref 0.5–1.9)

## 2024-10-19 LAB — MRSA NEXT GEN BY PCR, NASAL: MRSA by PCR Next Gen: NOT DETECTED

## 2024-10-19 LAB — TROPONIN T, HIGH SENSITIVITY
Troponin T High Sensitivity: 42 ng/L — ABNORMAL HIGH (ref 0–19)
Troponin T High Sensitivity: 36 ng/L — ABNORMAL HIGH (ref 0–19)

## 2024-10-19 LAB — D-DIMER, QUANTITATIVE: D-Dimer, Quant: 18.49 ug{FEU}/mL — ABNORMAL HIGH (ref 0.00–0.50)

## 2024-10-19 LAB — HEPARIN LEVEL (UNFRACTIONATED): Heparin Unfractionated: 0.52 [IU]/mL (ref 0.30–0.70)

## 2024-10-19 LAB — PRO BRAIN NATRIURETIC PEPTIDE: Pro Brain Natriuretic Peptide: 1650 pg/mL — ABNORMAL HIGH

## 2024-10-19 MED ORDER — IOHEXOL 350 MG/ML SOLN
75.0000 mL | Freq: Once | INTRAVENOUS | Status: AC | PRN
  Administered 2024-10-19: 75 mL via INTRAVENOUS

## 2024-10-19 MED ORDER — HEPARIN (PORCINE) 25000 UT/250ML-% IV SOLN
1200.0000 [IU]/h | INTRAVENOUS | Status: DC
  Administered 2024-10-19 – 2024-10-20 (×3): 1200 [IU]/h via INTRAVENOUS
  Filled 2024-10-19 (×3): qty 250

## 2024-10-19 MED ORDER — HEPARIN BOLUS VIA INFUSION
4500.0000 [IU] | Freq: Once | INTRAVENOUS | Status: AC
  Administered 2024-10-19: 4500 [IU] via INTRAVENOUS
  Filled 2024-10-19: qty 4500

## 2024-10-19 MED ORDER — LACTATED RINGERS IV SOLN: INTRAVENOUS | Status: DC

## 2024-10-19 NOTE — Subjective & Objective (Signed)
 Patient seen and examined.  Transferred back to [facility] service today. Had mechanical thrombectomy for submassive PE. On Eliquis  bid. Discussed with cards. Ok for discharge today.

## 2024-10-19 NOTE — Progress Notes (Signed)
" ° °  CTPA shows large bilateral PE with RV strain. Starting IV heparin . Cardiology notified.  Camellia Door, DO [facility] "

## 2024-10-19 NOTE — Hospital Course (Addendum)
 CC: syncope HPI:  Jeanette Martinez  is a 74 y.o. female with medical history significant of hypertension, hyperlipidemia, CKD 3, severe aortic stenosis, aortic regurgitation, refusal of blood products presenting with lightheadedness and syncope from cardiologist office.   Patient has known history of severe aortic stenosis.  Echocardiogram in March 2025 showed EF 65-70%, G1 DD, normal RV function.  Severe aortic calcification, severe aortic stenosis, moderate aortic regurgitation.   Has had increasing symptoms in the past couple weeks.  Increasing frequency of lightheadedness that now occurs several times a week.  She describes that when she exerts herself she gets lightheaded and will wave comes over her and she needs to sit down and breathe deeply to recover.  She has had episodes where she has fully syncopized including today.   Seen by cardiology outpatient today sent patient to the hospital for ED and inpatient cardiology/cardio thoracic surgery evaluation.   Patient denies fevers, chills, chest pain, shortness of breath, abdominal pain, constipation, nausea, vomiting  Significant Events: Admitted 10/18/2024 for syncope, severe aortic stenosis. 10-19-2024 CTPA showed submassive PE. Had another LOC episode. Code Blue called but pt did not lose her pulse. Transferred to ICU under PCCM 10-20-2024 pt underwent mechanical thrombectomy by IR 10-22-2024 pt transferred back to [facility]  Admission Labs: WBC 6.1, HgB 13.6, plt 260 Na 136, K 4.8, CO2 of 24, BUN 21, Scr 1.25, glu 128 D-dimer 18.49  Admission Imaging Studies: CXR No active cardiopulmonary disease. 2. Scoliosis.  Significant Labs: A1c of 6.0%  Significant Imaging Studies: CTPA shows large bilateral PE with RV strain Bilateral LE U/S shows right peroneal DVT Echo shows RV strain. LVEF 70%  Antibiotic Therapy: Anti-infectives (From admission, onward)    None       Procedures: 10-20-2024 mechanical thrombectomy of PE by  IR  Consultants: Cardiology PCCM IR

## 2024-10-19 NOTE — Assessment & Plan Note (Addendum)
 10/19/24 holding ACEI/hydrochlorothiazide  due to need for IV contrast with LHC/RHC next week. And also with CTPA today. Avoiding dehydration in pt's with severe AS as they are preload dependent.  10/22/24 stop all diuretics. Decreasing preload in severe AS is not a great idea. Discussed with cards. Continue with atenolol  25 mg daily.  F/u with cards in office for HTN management.

## 2024-10-19 NOTE — Assessment & Plan Note (Addendum)
 10/19/24 d-dimer elevated. Discussed with cards. They are going to order CTPA.  10/22/24 due to submassive PE and DVT.

## 2024-10-19 NOTE — Assessment & Plan Note (Addendum)
 10/19/24 cards starting TAVR workup.  10/22/24 due to her recent submassive PE, cards wants to hold off on TAVR workup and bring her back as outpatient. Will check proBNP prior to discharge for a baseline.

## 2024-10-19 NOTE — Assessment & Plan Note (Addendum)
 10/19/24 continue lipitor 10 mg daily.  10/22/24 continue lipitor 10 mg daily.

## 2024-10-19 NOTE — Consult Note (Signed)
 "  NAME:  Jeanette Martinez , MRN:  [id number], DOB:  06/14/1951, LOS: 1 ADMISSION DATE:  10/18/2024, CONSULTATION DATE:  10/19/2024  REFERRING MD:  Chen, TRH , CHIEF COMPLAINT:  syncope   History of Present Illness:  74 year old woman with severe AS was sent in for hospital admission from her cardiologist office.  She presented for routine office visit reporting lightheadedness and 2 episodes of syncope including day of office visit.  She was sent in to the ED with a presumptive diagnosis of severe symptomatic aortic stenosis that might require surgical treatment versus TAVR evaluation.  She was found to have a high D-dimer, CT angiogram showed multiple bilateral pulmonary emboli without saddle embolus, RV/LV ratio was 2.1. Echo surprisingly showed normal RVSP with decreased RV systolic function and slightly enlarged RV.  Severe aortic stenosis was confirmed.  She is started on IV heparin  and PCCM consulted  Pertinent  Medical History  HTN Hyperlipidemia CKD stage III Severe AS/AI Jehovah witness Significant Hospital Events: Including procedures, antibiotic start and stop dates in addition to other pertinent events     Interim History / Subjective:  Sitting up in bed Denies chest pain or dyspnea No distress  Objective    Blood pressure 107/71, pulse 61, temperature 98 F (36.7 C), temperature source Oral, resp. rate 19, height 5' 5 (1.651 m), weight 80.5 kg, SpO2 95%.        Intake/Output Summary (Last 24 hours) at 10/19/2024 1353 Last data filed at 10/19/2024 1000 Gross per 24 hour  Intake 240 ml  Output --  Net 240 ml   Filed Weights   10/18/24 1145 10/18/24 2100  Weight: 83.2 kg 80.5 kg    Examination: General: Elderly woman sitting up in bed, no distress HENT: No JVD, no pallor Lungs: Clear breath sounds bilateral no accessory muscle use Cardiovascular: S1-S2 regular, ESM 3/6 at base radiating over the precordium Abdomen: Soft, nontender Extremities: No edema Neuro:  Alert, interactive, nonfocal   Troponin 50, flat  Resolved problem list   Assessment and Plan   Submassive bilateral pulmonary emboli with RV strain noted on CT - Although not significant on echo - I wonder if LV hypertrophy due to severe AS compromises LV cavity and abnormally elevated RV/LV ratio here. She has low PESI and simplified PESI score but she was certainly symptomatic with syncopal episodes  -IV heparin  being started , obtain Doppler of both legs for completion - Options of aspiration thrombectomy/catheter directed lytics were discussed , hold off for this as salvage therapy in case of deterioration.  If develops hypotension, would consider IV lytics-not indicated currently - Discussed with IR  Multiple pulmonary nodules -largest 7 mm right upper lobe No prior CT for comparison. -Will need follow-up CT chest in 3 months, too small to biopsy now In setting of unexplained PE , would consider CT abdomen/pelvis during this admission to screen for abdominal malignancy  Severe aortic stenosis -TAVR being considered, she is a Jehovah's Witness which complicates and increases procedural risk of surgery  Labs   CBC: Recent Labs  Lab 10/18/24 1204 10/19/24 0215  WBC 6.1 6.5  HGB 13.6 12.2  HCT 42.5 37.2  MCV 90.8 89.4  PLT 260 211    Basic Metabolic Panel: Recent Labs  Lab 10/18/24 1204 10/19/24 0215  NA 136 139  K 4.8 4.4  CL 98 102  CO2 24 26  GLUCOSE 128* 81  BUN 21 23  CREATININE 1.25* 1.33*  CALCIUM  9.6 9.1  MG  --  1.7   GFR: Estimated Creatinine Clearance: 40.7 mL/min (A) (by C-G formula based on SCr of 1.33 mg/dL (H)). Recent Labs  Lab 10/18/24 1204 10/19/24 0215  WBC 6.1 6.5    Liver Function Tests: Recent Labs  Lab 10/19/24 0215  AST 45*  ALT 78*  ALKPHOS 48  BILITOT 0.5  PROT 7.0  ALBUMIN 3.6   No results for input(s): LIPASE, AMYLASE in the last 168 hours. No results for input(s): AMMONIA in the last 168 hours.  ABG No  results found for: PHART, PCO2ART, PO2ART, HCO3, TCO2, ACIDBASEDEF, O2SAT   Coagulation Profile: No results for input(s): INR, PROTIME in the last 168 hours.  Cardiac Enzymes: No results for input(s): CKTOTAL, CKMB, CKMBINDEX, TROPONINI in the last 168 hours.  HbA1C: No results found for: HGBA1C  CBG: No results for input(s): GLUCAP in the last 168 hours.  Review of Systems:   Lightheadedness for 2 days Passing out episode in the office No leg swelling No recent travel No chest pain or dyspnea  Past Medical History:  She,  has no past medical history on file.   Surgical History:  History reviewed. No pertinent surgical history.   Social History:   reports that she has never smoked. She has never used smokeless tobacco. She reports that she does not currently use alcohol. She reports that she does not use drugs.   Family History:  Her family history is not on file.   Allergies Allergies[1]   Home Medications  Prior to Admission medications  Medication Sig Start Date End Date Taking? Authorizing Provider  atenolol  (TENORMIN ) 25 MG tablet Take 25 mg by mouth daily. 10/25/21   [provider]  atorvastatin  (LIPITOR) 10 MG tablet Take 10 mg by mouth daily. 10/25/21   [provider]  lisinopril -hydrochlorothiazide  (ZESTORETIC ) 20-25 MG tablet  09/21/11   [provider]  Multiple Vitamin (MULTIVITAMIN) tablet Take 1 tablet by mouth daily.    [provider]      Harden Staff MD. FCCP. [facility] Pager : [phone]  If no response to pager , please call [phone] until 7 pm After 7:00 pm call Elink  [phone]   10/19/2024           [1] No Known Allergies  "

## 2024-10-19 NOTE — Progress Notes (Addendum)
 "  Rounding Note   Patient Name: Jeanette Martinez  Date of Encounter: 10/19/2024  [facility] Cardiologist: Alvan Ronal BRAVO, MD (Inactive)   Subjective BP 95/70 this morning, Cr 1.3.  Denied any chest pain or dyspnea.  No lightheadedness or syncope at rest, only occurs with exertion  Scheduled Meds:  atenolol   25 mg Oral Daily   atorvastatin   10 mg Oral Daily   sodium chloride  flush  3 mL Intravenous Q12H   Continuous Infusions:  PRN Meds: acetaminophen  **OR** acetaminophen , polyethylene glycol   Vital Signs  Vitals:   10/18/24 1743 10/18/24 2100 10/18/24 2140 10/19/24 0330  BP: (!) 152/84  (!) 141/73 95/70  Pulse: 83  93 65  Resp: (!) 21  19 17   Temp: 98.5 F (36.9 C)  98.3 F (36.8 C) 98.5 F (36.9 C)  TempSrc: Oral  Oral Oral  SpO2: 100%  95% 94%  Weight:  80.5 kg    Height:  5' 5 (1.651 m)     No intake or output data in the 24 hours ending 10/19/24 0853    10/18/2024    9:00 PM 10/18/2024   11:45 AM 10/18/2024   10:26 AM  Last 3 Weights  Weight (lbs) 177 lb 6.4 oz 183 lb 8 oz 183 lb 8 oz  Weight (kg) 80.468 kg 83.235 kg 83.235 kg      Telemetry Normal sinus rhythm- Personally Reviewed  ECG  No new ECG- Personally Reviewed  Physical Exam  GEN: No acute distress.   Neck: No JVD Cardiac: RRR, 3 out of 6 systolic murmur Respiratory: Clear to auscultation bilaterally. GI: Soft, nontender, non-distended  MS: No edema; No deformity. Neuro:  Nonfocal  Psych: Normal affect   Labs High Sensitivity Troponin:  No results for input(s): TROPONINIHS in the last 720 hours.  Recent Labs  Lab 10/18/24 1204 10/18/24 1446  TRNPT 54* 50*       Chemistry Recent Labs  Lab 10/18/24 1204 10/19/24 0215  NA 136 139  K 4.8 4.4  CL 98 102  CO2 24 26  GLUCOSE 128* 81  BUN 21 23  CREATININE 1.25* 1.33*  CALCIUM  9.6 9.1  MG  --  1.7  PROT  --  7.0  ALBUMIN  --  3.6  AST  --  45*  ALT  --  78*  ALKPHOS  --  48  BILITOT  --  0.5  GFRNONAA 46* 43*   ANIONGAP 14 11    Lipids No results for input(s): CHOL, TRIG, HDL, LABVLDL, LDLCALC, CHOLHDL in the last 168 hours.  Hematology Recent Labs  Lab 10/18/24 1204 10/19/24 0215  WBC 6.1 6.5  RBC 4.68 4.16  HGB 13.6 12.2  HCT 42.5 37.2  MCV 90.8 89.4  MCH 29.1 29.3  MCHC 32.0 32.8  RDW 15.0 15.0  PLT 260 211   Thyroid No results for input(s): TSH, FREET4 in the last 168 hours.  BNPNo results for input(s): BNP, PROBNP in the last 168 hours.  DDimer  Recent Labs  Lab 10/19/24 0215  DDIMER 18.49*     Radiology  DG Chest 2 View Result Date: 10/18/2024 CLINICAL DATA:  Chest pain. EXAM: CHEST - 2 VIEW COMPARISON:  None Available. FINDINGS: S-shaped scoliosis in the thoracolumbar spine. Lungs are clear. Heart size is within normal limits. Trachea is midline. No large pleural effusions. Negative for a pneumothorax. IMPRESSION: 1. No active cardiopulmonary disease. 2. Scoliosis. Electronically Signed   By: Juliene Balder M.D.   On: 10/18/2024  12:57    Cardiac Studies   Patient Profile   74 y.o. female with severe aortic stenosis who presents with episodes of syncope.  Assessment & Plan  Severe aortic stenosis: Echocardiogram 11/23/2023 showed EF 65 to 70%, severe aortic stenosis.  This was being monitored as outpatient as she denied symptoms.  She reports over the last week has been having exertional lightheadedness and while walking to her clinic appointment on 1/30 from the parking garage she had syncopal episode.  She was sent from clinic to the ED for admission for workup of her severe aortic stenosis -Update echocardiogram -Plan LHC/RHC on Monday -Consult structural heart team on Monday.  Suspect would pursue TAVR instead of SAVR as she is Jehovah's Witness and does not accept blood products, which would significantly increase the procedural risk of SAVR  Syncope: likely due to severe aortic stenosis as above.  D-dimer significantly elevated, will check  CTPA  Hypertension: On atenolol   Hyperlipidemia: On atorvastatin   CKD stage IIIa: Creatinine stable at 1.3    For questions or updates, please contact [facility] Please consult [email or web address] for contact info under   Signed, Lonni LITTIE Nanas, MD  10/19/2024, 8:53 AM    "

## 2024-10-19 NOTE — Assessment & Plan Note (Addendum)
 10/19/24 baseline Scr 1.4. holding ACEI/hydrochlorothiazide  due to need for IV contrast with LHC/RHC next week. And also with CTPA today. Avoiding dehydration in pt's with severe AS as they are preload dependent.   10/22/24 now off ARB and hydrochlorothiazide .

## 2024-10-19 NOTE — Plan of Care (Signed)
   Problem: Education: Goal: Knowledge of General Education information will improve Description Including pain rating scale, medication(s)/side effects and non-pharmacologic comfort measures Outcome: Progressing

## 2024-10-19 NOTE — Assessment & Plan Note (Addendum)
 10/19/24 verified that pt is a Jehovah's witness and refuses PRBC transfusions.

## 2024-10-20 ENCOUNTER — Inpatient Hospital Stay (HOSPITAL_COMMUNITY)

## 2024-10-20 ENCOUNTER — Encounter (HOSPITAL_COMMUNITY): Payer: Self-pay | Admitting: Internal Medicine

## 2024-10-20 ENCOUNTER — Other Ambulatory Visit: Payer: Self-pay

## 2024-10-20 DIAGNOSIS — N183 Chronic kidney disease, stage 3 unspecified: Secondary | ICD-10-CM | POA: Diagnosis not present

## 2024-10-20 DIAGNOSIS — Z86711 Personal history of pulmonary embolism: Secondary | ICD-10-CM | POA: Diagnosis not present

## 2024-10-20 DIAGNOSIS — I2699 Other pulmonary embolism without acute cor pulmonale: Secondary | ICD-10-CM | POA: Diagnosis not present

## 2024-10-20 DIAGNOSIS — R911 Solitary pulmonary nodule: Secondary | ICD-10-CM

## 2024-10-20 DIAGNOSIS — I129 Hypertensive chronic kidney disease with stage 1 through stage 4 chronic kidney disease, or unspecified chronic kidney disease: Secondary | ICD-10-CM

## 2024-10-20 DIAGNOSIS — Z531 Procedure and treatment not carried out because of patient's decision for reasons of belief and group pressure: Secondary | ICD-10-CM | POA: Diagnosis not present

## 2024-10-20 DIAGNOSIS — N1831 Chronic kidney disease, stage 3a: Secondary | ICD-10-CM | POA: Diagnosis not present

## 2024-10-20 DIAGNOSIS — I350 Nonrheumatic aortic (valve) stenosis: Secondary | ICD-10-CM | POA: Diagnosis not present

## 2024-10-20 DIAGNOSIS — R55 Syncope and collapse: Secondary | ICD-10-CM | POA: Diagnosis not present

## 2024-10-20 LAB — COMPREHENSIVE METABOLIC PANEL WITH GFR
ALT: 186 U/L — ABNORMAL HIGH (ref 0–44)
AST: 176 U/L — ABNORMAL HIGH (ref 15–41)
Albumin: 3.5 g/dL (ref 3.5–5.0)
Alkaline Phosphatase: 50 U/L (ref 38–126)
Anion gap: 12 (ref 5–15)
BUN: 21 mg/dL (ref 8–23)
CO2: 23 mmol/L (ref 22–32)
Calcium: 8.7 mg/dL — ABNORMAL LOW (ref 8.9–10.3)
Chloride: 103 mmol/L (ref 98–111)
Creatinine, Ser: 1.23 mg/dL — ABNORMAL HIGH (ref 0.44–1.00)
GFR, Estimated: 47 mL/min — ABNORMAL LOW
Glucose, Bld: 147 mg/dL — ABNORMAL HIGH (ref 70–99)
Potassium: 4.4 mmol/L (ref 3.5–5.1)
Sodium: 138 mmol/L (ref 135–145)
Total Bilirubin: 0.6 mg/dL (ref 0.0–1.2)
Total Protein: 6.7 g/dL (ref 6.5–8.1)

## 2024-10-20 LAB — TROPONIN T, HIGH SENSITIVITY
Troponin T High Sensitivity: 214 ng/L (ref 0–19)
Troponin T High Sensitivity: 593 ng/L (ref 0–19)

## 2024-10-20 LAB — HEPARIN LEVEL (UNFRACTIONATED): Heparin Unfractionated: 0.65 [IU]/mL (ref 0.30–0.70)

## 2024-10-20 LAB — CBC
HCT: 37.1 % (ref 36.0–46.0)
Hemoglobin: 11.9 g/dL — ABNORMAL LOW (ref 12.0–15.0)
MCH: 29 pg (ref 26.0–34.0)
MCHC: 32.1 g/dL (ref 30.0–36.0)
MCV: 90.3 fL (ref 80.0–100.0)
Platelets: 180 10*3/uL (ref 150–400)
RBC: 4.11 MIL/uL (ref 3.87–5.11)
RDW: 15.1 % (ref 11.5–15.5)
WBC: 6 10*3/uL (ref 4.0–10.5)
nRBC: 0 % (ref 0.0–0.2)

## 2024-10-20 LAB — GLUCOSE, CAPILLARY
Glucose-Capillary: 114 mg/dL — ABNORMAL HIGH (ref 70–99)
Glucose-Capillary: 182 mg/dL — ABNORMAL HIGH (ref 70–99)
Glucose-Capillary: 120 mg/dL — ABNORMAL HIGH (ref 70–99)

## 2024-10-20 LAB — LACTIC ACID, PLASMA
Lactic Acid, Venous: 2.2 mmol/L (ref 0.5–1.9)
Lactic Acid, Venous: 2.4 mmol/L (ref 0.5–1.9)

## 2024-10-20 LAB — POCT ACTIVATED CLOTTING TIME: Activated Clotting Time: 148 s

## 2024-10-20 LAB — MAGNESIUM: Magnesium: 1.6 mg/dL — ABNORMAL LOW (ref 1.7–2.4)

## 2024-10-20 MED ORDER — MIDAZOLAM HCL 2 MG/2ML IJ SOLN
INTRAMUSCULAR | Status: AC
  Filled 2024-10-20: qty 2

## 2024-10-20 MED ORDER — MIDAZOLAM HCL (PF) 2 MG/2ML IJ SOLN
INTRAMUSCULAR | Status: AC | PRN
  Administered 2024-10-20: .5 mg via INTRAVENOUS
  Administered 2024-10-20: 1 mg via INTRAVENOUS
  Administered 2024-10-20: .5 mg via INTRAVENOUS

## 2024-10-20 MED ORDER — IOHEXOL 300 MG/ML  SOLN
100.0000 mL | Freq: Once | INTRAMUSCULAR | Status: AC | PRN
  Administered 2024-10-20: 85 mL via INTRA_ARTERIAL

## 2024-10-20 MED ORDER — FENTANYL CITRATE (PF) 100 MCG/2ML IJ SOLN
INTRAMUSCULAR | Status: AC
  Filled 2024-10-20: qty 2

## 2024-10-20 MED ORDER — FENTANYL CITRATE (PF) 100 MCG/2ML IJ SOLN
INTRAMUSCULAR | Status: AC | PRN
  Administered 2024-10-20: 50 ug via INTRAVENOUS
  Administered 2024-10-20 (×2): 25 ug via INTRAVENOUS

## 2024-10-20 MED ORDER — SODIUM CHLORIDE 0.9 % IV SOLN: INTRAVENOUS | Status: DC

## 2024-10-20 MED ORDER — CHLORHEXIDINE GLUCONATE CLOTH 2 % EX PADS
6.0000 | MEDICATED_PAD | Freq: Every day | CUTANEOUS | Status: DC
  Administered 2024-10-20 – 2024-10-21 (×2): 6 via TOPICAL

## 2024-10-20 MED ORDER — HEPARIN SODIUM (PORCINE) 1000 UNIT/ML IJ SOLN
INTRAMUSCULAR | Status: AC | PRN
  Administered 2024-10-20: 5000 [IU] via INTRAVENOUS

## 2024-10-20 MED ORDER — ASPIRIN 81 MG PO CHEW
81.0000 mg | CHEWABLE_TABLET | ORAL | Status: AC
  Administered 2024-10-21: 81 mg via ORAL
  Filled 2024-10-20: qty 1

## 2024-10-20 MED ORDER — LIDOCAINE HCL 1 % IJ SOLN
INTRAMUSCULAR | Status: AC
  Filled 2024-10-20: qty 20

## 2024-10-20 MED ORDER — HEPARIN SODIUM (PORCINE) 1000 UNIT/ML IJ SOLN
INTRAMUSCULAR | Status: AC
  Filled 2024-10-20: qty 10

## 2024-10-20 MED ORDER — MAGNESIUM SULFATE 4 GM/100ML IV SOLN
4.0000 g | Freq: Once | INTRAVENOUS | Status: AC
  Administered 2024-10-20: 4 g via INTRAVENOUS
  Filled 2024-10-20: qty 100

## 2024-10-20 MED ORDER — LIDOCAINE HCL 1 % IJ SOLN
20.0000 mL | Freq: Once | INTRAMUSCULAR | Status: AC
  Administered 2024-10-20: 20 mL

## 2024-10-20 MED ORDER — ORAL CARE MOUTH RINSE: 15.0000 mL | OROMUCOSAL | Status: DC | PRN

## 2024-10-20 MED ORDER — NOREPINEPHRINE 4 MG/250ML-% IV SOLN
INTRAVENOUS | Status: AC
  Filled 2024-10-20: qty 250

## 2024-10-20 NOTE — Progress Notes (Signed)
 PHARMACY - ANTICOAGULATION CONSULT NOTE  Pharmacy Consult for heparin   Indication: pulmonary embolus  Allergies[1]  Patient Measurements: Height: 5' 5 (165.1 cm) Weight: 80.5 kg (177 lb 6.4 oz) IBW/kg (Calculated) : 57 HEPARIN  DW (KG): 74  Vital Signs: Temp: 98 F (36.7 C) (02/01 0734) Temp Source: Oral (02/01 0734) BP: 136/92 (02/01 0800) Pulse Rate: 101 (02/01 0800)  Labs: Recent Labs    10/18/24 1204 10/19/24 0215 10/19/24 2206 10/20/24 0730  HGB 13.6 12.2  --  11.9*  HCT 42.5 37.2  --  37.1  PLT 260 211  --  180  HEPARINUNFRC  --   --  0.52 0.65  CREATININE 1.25* 1.33*  --  1.23*    Estimated Creatinine Clearance: 44 mL/min (A) (by C-G formula based on SCr of 1.23 mg/dL (H)).   Medical History: History reviewed. No pertinent past medical history.  Medications:  Medications Prior to Admission  Medication Sig Dispense Refill Last Dose/Taking   atenolol  (TENORMIN ) 25 MG tablet Take 25 mg by mouth daily.   10/18/2024 Morning   atorvastatin  (LIPITOR) 10 MG tablet Take 10 mg by mouth daily.   10/18/2024 Morning   lisinopril -hydrochlorothiazide  (ZESTORETIC ) 20-25 MG tablet    10/18/2024 Morning   Multiple Vitamin (MULTIVITAMIN) tablet Take 1 tablet by mouth daily.   10/18/2024 Morning   Scheduled:   atorvastatin   10 mg Oral Daily   Chlorhexidine  Gluconate Cloth  6 each Topical Daily   sodium chloride  flush  3 mL Intravenous Q12H    Assessment: 74 yo female here with syncope and CT shows PE w/ severe right heart strain and pharmacy consulted to dose IV heparin . No anticoagulation noted PTA.  -heparin  level 0.65 ant at goal on 1200 units/hr, CBC stable -plans for possible mechanical thrombectomy   Goal of Therapy:  Heparin  level 0.3-0.7 units/ml Monitor platelets by anticoagulation protocol: Yes   Plan:  -Continue heparin  at 1200 units/hr -Heparin  level  daily wth CBC daily  Prentice Poisson, PharmD Clinical Pharmacist **Pharmacist phone directory can now be  found on [email or web address] ([email or web address]).  Listed under [facility].       [1] No Known Allergies

## 2024-10-20 NOTE — Progress Notes (Signed)
 VASCULAR LAB    Bilateral lower extremity venous duplex has been performed.  See CV proc for preliminary results.   Tylique Aull, RVT 10/20/2024, 2:59 PM

## 2024-10-20 NOTE — Consult Note (Signed)
 "     Chief Complaint: Patient was seen in consultation today for pulmonary embolus  Referring Physician(s): Dr. Toribio Sharps  Supervising Physician: Philip Cornet  Patient Status: [facility] - In-pt  History of Present Illness: Jeanette Martinez  is a 74 y.o. female with severe AS was sent in for hospital admission from her cardiologist office. She presented for routine office visit reporting lightheadedness and 2 episodes of syncope including day of office visit. She was sent in to the ED with a presumptive diagnosis of severe symptomatic aortic stenosis that might require surgical treatment versus TAVR evaluation. She was found to have a high D-dimer, CT angiogram showed multiple bilateral pulmonary emboli without saddle embolus.   Patient with mild tachycardia with HR 100-108, SBP 100-136.  She is resting comfortably in ICU this AM with family at bedside after pre-syncopal vs. Unresponsive event overnight.  She has started heparin  and Mg gtt with tolerance.  She is aware of recommendation of medical team for thrombectomy vs. Lysis.   Jehovah's witness with refusal of blood products on file.  FULL CODE.   History reviewed. No pertinent past medical history.  History reviewed. No pertinent surgical history.  Allergies: Patient has no known allergies.  Medications: Prior to Admission medications  Medication Sig Start Date End Date Taking? Authorizing Provider  atenolol  (TENORMIN ) 25 MG tablet Take 25 mg by mouth daily. 10/25/21  Yes [provider]  atorvastatin  (LIPITOR) 10 MG tablet Take 10 mg by mouth daily. 10/25/21  Yes [provider]  lisinopril -hydrochlorothiazide  (ZESTORETIC ) 20-25 MG tablet  09/21/11  Yes [provider]  Multiple Vitamin (MULTIVITAMIN) tablet Take 1 tablet by mouth daily.   Yes [provider]     History reviewed. No pertinent family history.  Social History   Socioeconomic History   Marital status: Single    Spouse name: Not on  file   Number of children: Not on file   Years of education: Not on file   Highest education level: 12th grade  Occupational History   Not on file  Tobacco Use   Smoking status: Never   Smokeless tobacco: Never  Substance and Sexual Activity   Alcohol use: Not Currently   Drug use: Never   Sexual activity: Not Currently  Other Topics Concern   Not on file  Social History Narrative   Not on file   Social Drivers of Health   Tobacco Use: Low Risk (10/20/2024)   Patient History    Smoking Tobacco Use: Never    Smokeless Tobacco Use: Never    Passive Exposure: Not on file  Financial Resource Strain: Low Risk (10/17/2024)   Overall Financial Resource Strain (CARDIA)    Difficulty of Paying Living Expenses: Not hard at all  Food Insecurity: No Food Insecurity (10/18/2024)   Epic    Worried About Radiation Protection Practitioner of Food in the Last Year: Never true    Ran Out of Food in the Last Year: Never true  Transportation Needs: No Transportation Needs (10/18/2024)   Epic    Lack of Transportation (Medical): No    Lack of Transportation (Non-Medical): No  Physical Activity: Insufficiently Active (10/17/2024)   Exercise Vital Sign    Days of Exercise per Week: 2 days    Minutes of Exercise per Session: 20 min  Stress: No Stress Concern Present (10/17/2024)   [employer] of Occupational Health - Occupational Stress Questionnaire    Feeling of Stress: Not at all  Social Connections: Moderately Integrated (10/17/2024)  Social Advertising Account Executive    Frequency of Communication with Friends and Family: More than three times a week    Frequency of Social Gatherings with Friends and Family: More than three times a week    Attends Religious Services: More than 4 times per year    Active Member of [employer] or Organizations: Yes    Attends Engineer, Structural: More than 4 times per year    Marital Status: Divorced  Depression (PHQ2-9): Not on file  Alcohol Screen: Not on file   Housing: Low Risk (10/18/2024)   Epic    Unable to Pay for Housing in the Last Year: No    Number of Times Moved in the Last Year: 0    Homeless in the Last Year: No  Utilities: Not At Risk (10/18/2024)   Epic    Threatened with loss of utilities: No  Health Literacy: Not on file     Review of Systems: A 12 point ROS discussed and pertinent positives are indicated in the HPI above.  All other systems are negative.  Review of Systems  Constitutional:  Negative for fatigue and fever.  Respiratory:  Positive for chest tightness and shortness of breath. Negative for cough.   Cardiovascular:  Negative for chest pain.  Gastrointestinal:  Positive for nausea. Negative for abdominal pain.  Musculoskeletal:  Negative for back pain.  Psychiatric/Behavioral:  Negative for behavioral problems and confusion.     Vital Signs: BP (!) 136/92 (BP Location: Left Arm)   Pulse (!) 101   Temp 98 F (36.7 C) (Oral)   Resp (!) 21   Ht 5' 5 (1.651 m)   Wt 177 lb 6.4 oz (80.5 kg)   SpO2 98%   BMI 29.52 kg/m   Physical Exam Vitals and nursing note reviewed.  Constitutional:      General: She is not in acute distress.    Appearance: She is well-developed. She is not ill-appearing.  Cardiovascular:     Rate and Rhythm: Regular rhythm. Tachycardia present.  Pulmonary:     Effort: Pulmonary effort is normal.     Comments: Conversant without shortness of breath Musculoskeletal:     Cervical back: Normal range of motion.  Skin:    General: Skin is warm and dry.  Neurological:     General: No focal deficit present.     Mental Status: She is alert and oriented to person, place, and time.  Psychiatric:        Mood and Affect: Mood normal.        Behavior: Behavior normal.      MD Evaluation Airway: WNL Heart: WNL Abdomen: WNL Chest/ Lungs: WNL ASA  Classification: 3 Mallampati/Airway Score: Two   Imaging: DG CHEST PORT 1 VIEW Result Date: 10/20/2024 EXAM: 1 VIEW(S) XRAY OF THE  CHEST 10/20/2024 06:03:00 AM COMPARISON: 10/18/2024 CLINICAL HISTORY: Dyspnea. FINDINGS: LINES, TUBES AND DEVICES: Left chest defibrillator pads noted. LUNGS AND PLEURA: No focal pulmonary opacity. No pleural effusion. No pneumothorax. HEART AND MEDIASTINUM: Cardiomegaly, unchanged. Atherosclerotic calcifications. BONES AND SOFT TISSUES: Scoliotic curvature of thoracolumbar spine. IMPRESSION: 1. No acute findings. 2. Cardiomegaly. Electronically signed by: Waddell Calk MD 10/20/2024 06:11 AM EST RP Workstation: [id number]   CT Angio Chest Pulmonary Embolism (PE) W or WO Contrast Result Date: 10/19/2024 CLINICAL DATA:  Dizziness and loss of consciousness. EXAM: CT ANGIOGRAPHY CHEST WITH CONTRAST TECHNIQUE: Multidetector CT imaging of the chest was performed using the standard protocol during bolus administration of intravenous contrast. Multiplanar  CT image reconstructions and MIPs were obtained to evaluate the vascular anatomy. RADIATION DOSE REDUCTION: This exam was performed according to the departmental dose-optimization program which includes automated exposure control, adjustment of the mA and/or kV according to patient size and/or use of iterative reconstruction technique. CONTRAST:  75mL OMNIPAQUE  IOHEXOL  350 MG/ML SOLN COMPARISON:  None Available. FINDINGS: Cardiovascular: Satisfactory opacification of the pulmonary arteries to the segmental level. An extensive amount of intraluminal low attenuation is seen within the distal aspects of the right and left pulmonary arteries, with extension to involve multiple bilateral upper lobe, right middle lobe and bilateral lower lobe branches of the pulmonary arteries. No saddle embolus is identified. There is mild cardiomegaly with diffuse left ventricular hypertrophy and severe right heart strain (RV/LV ratio of 2.18). No pericardial effusion. Mediastinum/Nodes: No enlarged mediastinal, hilar, or axillary lymph nodes. Thyroid gland, trachea, and esophagus  demonstrate no significant findings. Lungs/Pleura: A 7 mm pulmonary nodule is seen within the posterior aspect of the right upper lobe (axial CT image 50, CT series 6). 4 mm and 5 mm pulmonary nodules are present within the posterolateral aspect of the right middle lobe (axial CT images 78 and 79, CT series 6). There is a 7 mm lingular pulmonary nodule (axial CT image 86, CT series 6). A 5 mm left lower lobe pulmonary nodule is also seen (axial CT image 65, CT series 6). A 6 mm pulmonary nodule is noted within the anterior aspect of the left upper lobe (axial CT image 42, CT series 6). Very areas of mild ground-glass appearing lung parenchyma are seen bilaterally. No pleural effusion or pneumothorax is identified. Upper Abdomen: There is a small to moderate sized hiatal hernia. Musculoskeletal: Marked severity dextroscoliosis of the thoracic spine is seen with multilevel degenerative changes noted. Review of the MIP images confirms the above findings. IMPRESSION: 1. Extensive bilateral pulmonary embolism with severe right heart strain (RV/LV ratio of 2.18). 2. Multiple bilateral pulmonary nodules, as described above. Non-contrast chest CT at 3-6 months is recommended. If the nodules are stable at time of repeat CT, then future CT at 18-24 months (from today's scan) is considered optional for low-risk patients, but is recommended for high-risk patients. This recommendation follows the consensus statement: Guidelines for Management of Incidental Pulmonary Nodules Detected on CT Images: From the Fleischner Society 2017; Radiology 2017; 284:228-243. 3. Small to moderate sized hiatal hernia. 4. Marked severity dextroscoliosis of the thoracic spine with multilevel degenerative changes. Electronically Signed   By: Suzen Dials M.D.   On: 10/19/2024 13:05   ECHOCARDIOGRAM COMPLETE Result Date: 10/19/2024    ECHOCARDIOGRAM REPORT   Patient Name:   Jeanette Martinez  Date of Exam: 10/19/2024 Medical Rec #:  [id number]         Height:       65.0 in Accession #:    [id number]       Weight:       177.4 lb Date of Birth:  Jul 05, 1951        BSA:          1.880 m Patient Age:    73 years         BP:           153/82 mmHg Patient Gender: F                HR:           83 bpm. Exam Location:  Inpatient Procedure: 2D Echo, Cardiac Doppler and Color Doppler (Both Spectral and  Color            Flow Doppler were utilized during procedure). Indications:    Aortic stenosis I35.0  History:        Patient has prior history of Echocardiogram examinations, most                 recent 11/23/2023. Aortic Valve Disease.  Sonographer:    Jayson Gaskins Referring Phys: [id number] PETER C NISHAN IMPRESSIONS  1. Left ventricular ejection fraction, by estimation, is 70 to 75%. The left ventricle has hyperdynamic function. The left ventricle has no regional wall motion abnormalities. There is mild concentric left ventricular hypertrophy. Left ventricular diastolic parameters are consistent with Grade I diastolic dysfunction (impaired relaxation).  2. RV-RA gradient 23 mmHg suggesting normal estimated RVSP if CVP normal. Right ventricular systolic function is mildly reduced. The right ventricular size is mildly enlarged.  3. Left atrial size was mildly dilated.  4. Right atrial size was mildly dilated.  5. The mitral valve is degenerative. Trivial mitral valve regurgitation.  6. The aortic valve is tricuspid. There is severe calcifcation of the aortic valve. Aortic valve regurgitation is trivial. Severe aortic valve stenosis. Aortic valve mean gradient measures 38.0 mmHg. Dimentionless index 0.27.  7. Unable to estimate CVP. Comparison(s): Prior images reviewed side by side. LVEF 70-75% with mild LVH and grade 1 diastolic dysfunction. Severe aortic stenosis with mean AV gradient up to 38 mmHg and DI 0.27. FINDINGS  Left Ventricle: Left ventricular ejection fraction, by estimation, is 70 to 75%. The left ventricle has hyperdynamic function. The left ventricle has no  regional wall motion abnormalities. The left ventricular internal cavity size was normal in size. There is mild concentric left ventricular hypertrophy. Left ventricular diastolic parameters are consistent with Grade I diastolic dysfunction (impaired relaxation). Right Ventricle: RV-RA gradient 23 mmHg suggesting normal estimated RVSP if CVP normal. The right ventricular size is mildly enlarged. No increase in right ventricular wall thickness. Right ventricular systolic function is mildly reduced. Left Atrium: Left atrial size was mildly dilated. Right Atrium: Right atrial size was mildly dilated. Pericardium: There is no evidence of pericardial effusion. Mitral Valve: The mitral valve is degenerative in appearance. Trivial mitral valve regurgitation. MV peak gradient, 5.7 mmHg. The mean mitral valve gradient is 3.0 mmHg. Tricuspid Valve: The tricuspid valve is grossly normal. Tricuspid valve regurgitation is mild. Aortic Valve: The aortic valve is tricuspid. There is severe calcifcation of the aortic valve. There is moderate aortic valve annular calcification. Aortic valve regurgitation is trivial. Severe aortic stenosis is present. Aortic valve mean gradient measures 38.0 mmHg. Aortic valve peak gradient measures 63.4 mmHg. Aortic valve area, by VTI measures 0.77 cm. Pulmonic Valve: The pulmonic valve was grossly normal. Pulmonic valve regurgitation is trivial. Aorta: The aortic root and ascending aorta are structurally normal, with no evidence of dilitation. Venous: Unable to estimate CVP. The inferior vena cava was not well visualized. IAS/Shunts: No atrial level shunt detected by color flow Doppler. Additional Comments: 3D was performed not requiring image post processing on an independent workstation and was indeterminate.  LEFT VENTRICLE PLAX 2D LVIDd:         3.50 cm   Diastology LVIDs:         2.10 cm   LV e' medial:    5.00 cm/s LV PW:         1.10 cm   LV E/e' medial:  13.2 LV IVS:        1.00 cm  LV e'  lateral:   6.64 cm/s LVOT diam:     1.90 cm   LV E/e' lateral: 9.9 LV SV:         65 LV SV Index:   34 LVOT Area:     2.84 cm  RIGHT VENTRICLE RV S prime:     10.40 cm/s TAPSE (M-mode): 2.9 cm LEFT ATRIUM             Index        RIGHT ATRIUM           Index LA Vol (A2C):   53.9 ml 28.67 ml/m  RA Area:     20.10 cm LA Vol (A4C):   70.6 ml 37.56 ml/m  RA Volume:   57.70 ml  30.69 ml/m LA Biplane Vol: 62.2 ml 33.09 ml/m  AORTIC VALVE AV Area (Vmax):    0.73 cm AV Area (Vmean):   0.77 cm AV Area (VTI):     0.77 cm AV Vmax:           398.00 cm/s AV Vmean:          287.000 cm/s AV VTI:            0.842 m AV Peak Grad:      63.4 mmHg AV Mean Grad:      38.0 mmHg LVOT Vmax:         102.00 cm/s LVOT Vmean:        78.400 cm/s LVOT VTI:          0.228 m LVOT/AV VTI ratio: 0.27  AORTA Ao Root diam: 2.40 cm MITRAL VALVE                TRICUSPID VALVE MV Area (PHT): 3.51 cm     TR Peak grad:   23.0 mmHg MV Area VTI:   2.27 cm     TR Vmax:        240.00 cm/s MV Peak grad:  5.7 mmHg MV Mean grad:  3.0 mmHg     SHUNTS MV Vmax:       1.19 m/s     Systemic VTI:  0.23 m MV Vmean:      84.7 cm/s    Systemic Diam: 1.90 cm MV Decel Time: 216 msec MV E velocity: 66.00 cm/s MV A velocity: 113.00 cm/s MV E/A ratio:  0.58 Jayson Sierras MD Electronically signed by Jayson Sierras MD Signature Date/Time: 10/19/2024/11:58:34 AM    Final    DG Chest 2 View Result Date: 10/18/2024 CLINICAL DATA:  Chest pain. EXAM: CHEST - 2 VIEW COMPARISON:  None Available. FINDINGS: S-shaped scoliosis in the thoracolumbar spine. Lungs are clear. Heart size is within normal limits. Trachea is midline. No large pleural effusions. Negative for a pneumothorax. IMPRESSION: 1. No active cardiopulmonary disease. 2. Scoliosis. Electronically Signed   By: Juliene Balder M.D.   On: 10/18/2024 12:57    Labs:  CBC: Recent Labs    10/18/24 1204 10/19/24 0215 10/20/24 0730  WBC 6.1 6.5 6.0  HGB 13.6 12.2 11.9*  HCT 42.5 37.2 37.1  PLT 260 211 180     COAGS: No results for input(s): INR, APTT in the last 8760 hours.  BMP: Recent Labs    10/27/23 0941 10/18/24 1204 10/19/24 0215 10/20/24 0730  NA 137 136 139 138  K 4.7 4.8 4.4 4.4  CL 100 98 102 103  CO2 22 24 26 23   GLUCOSE 128* 128* 81 147*  BUN 27 21 23  21  CALCIUM  9.4 9.6 9.1 8.7*  CREATININE 1.40* 1.25* 1.33* 1.23*  GFRNONAA  --  46* 43* 47*    LIVER FUNCTION TESTS: Recent Labs    10/19/24 0215 10/20/24 0730  BILITOT 0.5 0.6  AST 45* 176*  ALT 78* 186*  ALKPHOS 48 50  PROT 7.0 6.7  ALBUMIN 3.6 3.5    TUMOR MARKERS: No results for input(s): AFPTM, CEA, CA199, CHROMGRNA in the last 8760 hours.  Assessment and Plan: Pulmonary embolus Patient admtitedf from PCP office with lightheadness and syncope.  Initially attributed to known prior cardiac history including severe AS, however subsequent work-up has revealed bilateral saddle PE.  IR consulted for PE thrombectomy.  Dr. Philip has discussed with patient and her family at bedside.   Since admission, patient has had mild tachycardia to 108bpm, mild tachypnea with RR >20, but <30.  By PESI criteria she is Class II, Low Risk +/- accounting for her cardiac history.  Her greatest risk with PE burden is  her severe aortic stenosis with potential for cardiopulmonary compromise evidenced by syncopal events, troponin leak, and early signs of right heart strain by CT and ECHO.   Patient and family are aware of the goals, risks, and benefits and are agreeable to proceed WITH THROMBECTOMY OR THROMBOLYSIS.   She has been NPO today.  On heparin  and Mg gtt.  She does have an active blood products refusal form on file, however is otherwise FULL CODE.   Risks and benefits of PE thrombectomy discussed with the patient including, but not limited to bleeding, pulmonary hemorrhage, perforation or dissection of cardiovascular structures, vascular injury, stroke, contrast induced renal failure, and  infection.    Thank you for this interesting consult.  I greatly enjoyed meeting Sheresa Davis  and look forward to participating in their care.  A copy of this report was sent to the requesting provider on this date.  Electronically Signed: Eldo Umanzor Sue-Ellen Glenis Musolf, PA 10/20/2024, 9:40 AM   I spent a total of 55 Miinutes    in face to face in clinical consultation, greater than 50% of which was counseling/coordinating care for pulmonary embolus.   "

## 2024-10-20 NOTE — Progress Notes (Signed)
 SDOH Resources on AVS

## 2024-10-20 NOTE — Code Documentation (Addendum)
" °  Patient Name: Jeanette Martinez    MRN: [id number]   Date of Birth/ Sex: 02-17-1951 , female      Admission Date: 10/18/2024  Attending Provider: Laurence Locus, DO  Primary Diagnosis: Symptomatic severe aortic stenosis with normal ejection fraction   Indication: Pt was in her usual state of health until this AM, when she was noted to be unresponsive with a pulse. Code blue was subsequently called. At the time of arrival on scene, the patient was awake and alert on non-rebreather.   Technical Description:  - CPR performance duration:  0   - Was defibrillation or cardioversion used? No   - Was external pacer placed? Yes  - Was patient intubated pre/post CPR? No   Medications Administered: Y = Yes; Blank = No Amiodarone    Atropine    Calcium     Epinephrine    Lidocaine     Magnesium     Norepinephrine     Phenylephrine    Sodium bicarbonate    Vasopressin     Post CPR evaluation:  - Final Status - Was patient successfully resuscitated ? Yes - What is current rhythm? Sinus Tachycardia - What is current hemodynamic status? Stable  Miscellaneous Information:  - Labs sent, including: CBC, CMP, Mg, Troponin, Lactic Acid, CXR, EKG, TTE  - Primary team notified?  Primary team paged   - Family Notified? Yes, daughter at bedside  - Additional notes/ transfer status: Transfer to ICU     Jolaine Pac, DO  10/20/2024, 4:48 AM  "

## 2024-10-20 NOTE — Plan of Care (Signed)
 ?  Problem: Clinical Measurements: ?Goal: Ability to maintain clinical measurements within normal limits will improve ?Outcome: Progressing ?Goal: Will remain free from infection ?Outcome: Progressing ?Goal: Diagnostic test results will improve ?Outcome: Progressing ?  ?

## 2024-10-20 NOTE — Procedures (Signed)
 Interventional Radiology Procedure:   Indications:  Submassive pulmonary embolism with syncopal episodes  Procedure: Bilateral pulmonary artery thrombectomy   Findings:   Pre pressure:  72/22, mean 42;   Post pressure: 40/17, mean 24.  See full report in Imaging dictation.   Complications: None     EBL: Minimal  Plan: Bedrest 6 hours.  Keep groin suture in place for at least 24 hours.    Connery Shiffler R. Philip, MD  Pager: [phone]

## 2024-10-20 NOTE — Progress Notes (Signed)
 SPIRITUAL CARE AND COUNSELING CONSULT NOTE   VISIT SUMMARY Chaplain responded to Code page. Visited briefly with daughter who was receiving updates from the medical staff. Daughter grateful for the visit, but report they are doing well and do not need chaplain support at this time. Chaplain support available, as needed.   SPIRITUAL ENCOUNTER                                                                                                                                                                      Type of Visit: Initial Care provided to:: Patient, Family Conversation partners present during encounter: Nurse Referral source: Nurse (RN/NT/LPN) Reason for visit: Code OnCall Visit: Yes   INTERVENTIONS   Spiritual Care Interventions Made: Established relationship of care and support, Compassionate presence    INTERVENTION OUTCOMES   Outcomes: Connection to spiritual care, Awareness around self/spiritual resourses, Awareness of support   If immediate needs arise, please contact [place] 24 hour on call [phone]   Roxanne Slade  10/20/2024 3:13 PM

## 2024-10-20 NOTE — Discharge Instructions (Addendum)
 Social Connections Resources    Active Adults Program [email or web address]-[email or web address]             -Adding Health to Our Years Courses             -Aquatics             -Exercise Classes: Yoga, Dance, [place], etc.             -Book Club, Games       Locations vary by activity - Main Contact # [phone]) - see calendars on website listed above.   Senior Centers -[facility] [address] [place], [place] [address] / [phone] ext [phone] -[facility] [address], [place] [address] / [phone] [facility]) [address], [place], [place] [address] / [phone] [facility] locations in [place], [place], and [place] / [phone] -[facility] [address], [place] [address] / [phone]   [facility]) [address] [place], [place] [address] - Office #: [phone] - Enrollment Phone #: [phone]   [facility] Line  Call toll free, available 24 hours a day, [phone]    [place] [address]   2-1-1 is another useful way to locate resources in the community. Visit shedsizes.ch to find service information online. If you need additional assistance, 2-1-1 Referral Specialists are available 24 hours a day, every day by dialing 2-1-1 or [phone] from any phone. The call is free, confidential, and available in any language.    ===================================================  Information on my medicine - ELIQUIS  (apixaban )  This medication education was reviewed with me or my healthcare representative as part of my discharge preparation.  The pharmacist that spoke with me during my hospital stay was:  Maurilio RAYMOND Fila, RPH  Why was Eliquis  prescribed for you? Eliquis  was prescribed to treat blood clots that may have been found in  the veins of your legs (deep vein thrombosis) or in your lungs (pulmonary embolism) and to reduce the risk of them occurring again.  What do You need to know about Eliquis  ? The starting dose is 10 mg (two 5 mg tablets) taken TWICE daily for the FIRST SEVEN (7) DAYS, then on 10/28/24  the dose is reduced to ONE 5 mg tablet taken TWICE daily.  Eliquis  may be taken with or without food.   Try to take the dose about the same time in the morning and in the evening. If you have difficulty swallowing the tablet whole please discuss with your pharmacist how to take the medication safely.  Take Eliquis  exactly as prescribed and DO NOT stop taking Eliquis  without talking to the doctor who prescribed the medication.  Stopping may increase your risk of developing a new blood clot.  Refill your prescription before you run out.  After discharge, you should have regular check-up appointments with your healthcare provider that is prescribing your Eliquis .    What do you do if you miss a dose? If a dose of ELIQUIS  is not taken at the scheduled time, take it as soon as possible on the same day and twice-daily administration should be resumed. The dose should not be doubled to make up for a missed dose.  Important Safety Information A possible side effect of Eliquis  is bleeding. You should call your healthcare provider right away if you experience any of the following: Bleeding from an injury or your nose that  does not stop. Unusual colored urine (red or dark brown) or unusual colored stools (red or black). Unusual bruising for unknown reasons. A serious fall or if you hit your head (even if there is no bleeding).  Some medicines may interact with Eliquis  and might increase your risk of bleeding or clotting while on Eliquis . To help avoid this, consult your healthcare provider or pharmacist prior to using any new prescription or non-prescription medications, including herbals, vitamins, non-steroidal  anti-inflammatory drugs (NSAIDs) and supplements.  This website has more information on Eliquis  (apixaban ): [email or web address] [email or web address] [email or web address]

## 2024-10-20 NOTE — Progress Notes (Signed)
 PHARMACY - ANTICOAGULATION CONSULT NOTE  Pharmacy Consult for heparin   Indication: pulmonary embolus  Allergies[1]  Patient Measurements: Height: 5' 5 (165.1 cm) Weight: 80.5 kg (177 lb 6.4 oz) IBW/kg (Calculated) : 57 HEPARIN  DW (KG): 74  Vital Signs: Temp: 98.2 F (36.8 C) (01/31 2347) Temp Source: Oral (01/31 2347) BP: 119/70 (01/31 2347) Pulse Rate: 68 (01/31 2347)  Labs: Recent Labs    10/18/24 1204 10/19/24 0215 10/19/24 2206  HGB 13.6 12.2  --   HCT 42.5 37.2  --   PLT 260 211  --   HEPARINUNFRC  --   --  0.52  CREATININE 1.25* 1.33*  --     Estimated Creatinine Clearance: 40.7 mL/min (A) (by C-G formula based on SCr of 1.33 mg/dL (H)).  Assessment: 74 yo female here with syncope and CT shows PE w/ severe right heart strain and pharmacy consulted to dose IV heparin . No anticoagulation noted PTA.  -heparin  level at goal on 1200 units/hr. Recheck level with AM labs. No issues per RN  Goal of Therapy:  Heparin  level 0.3-0.7 units/ml Monitor platelets by anticoagulation protocol: Yes   Plan:  -Heparin  infusion at 1200 units/hr -Confirmatory heparin  level in 8 hours and daily wth CBC daily  Rutha Poplar, PharmD, BCPS Clinical Pharmacist 10/20/2024 1:16 AM       [1] No Known Allergies

## 2024-10-20 NOTE — Progress Notes (Addendum)
 10/20/2024 Patient went out again. Low threshold to consider more advanced PE interventions given recurrent syncope. Ground team has evaluated.  Would do following for now: Transfer to ICU Check lactate, repeat limited echo Consider mechanical thrombectomy pending above  Systemic lytics tricky with refusal of blood products, will need to clarify if things are looking worse.   Rolan Sharps MD

## 2024-10-20 NOTE — Progress Notes (Addendum)
 Patient ID: Jeanette Martinez , female   DOB: Jan 24, 1951, 74 y.o.   MRN: [id number]     Advanced Heart Failure Rounding Note  Cardiologist: Alvan Ronal BRAVO, MD (Inactive)  Chief Complaint: Syncope Patient Profile   Jeanette Martinez  is a 74 y.o. female with severe AS being monitored as outpatient, presented with syncope.  Elevated D dimer noted, found to have bilateral submassive PEs.    Significant events:   1/31: Echo with EF 70-75%, mild LVH, severe AS mean gradient 38 mmHg, mild RV enlargement/mild RV dilation, peak RV-RA gradient 23 mmHg.  2/1: Syncopal episode in room, no rhythm change.  Moved to ICU.   Subjective:    SBP 100s-120s this morning. She is on 3L [place].  Denies chest pain or dyspnea.  She remains on heparin  gtt.   Lactate 2.2, pro-BNP 1650, TnT 214   Objective:   Weight Range: 80.5 kg Body mass index is 29.52 kg/m.   Vital Signs:   Temp:  [97.7 F (36.5 C)-98.4 F (36.9 C)] 98 F (36.7 C) (02/01 0734) Pulse Rate:  [61-104] 101 (02/01 0700) Resp:  [16-26] 19 (02/01 0700) BP: (85-132)/(58-76) 108/73 (02/01 0700) SpO2:  [91 %-99 %] 97 % (02/01 0700)    Weight change: Filed Weights   10/18/24 1145 10/18/24 2100  Weight: 83.2 kg 80.5 kg    Intake/Output:   Intake/Output Summary (Last 24 hours) at 10/20/2024 0837 Last data filed at 10/20/2024 0700 Gross per 24 hour  Intake 2261.11 ml  Output 1550 ml  Net 711.11 ml     Physical Exam   General: NAD Neck: JVP 8-9 cm, no thyromegaly or thyroid nodule.  Lungs: Clear to auscultation bilaterally with normal respiratory effort. CV: Nondisplaced PMI.  Heart regular S1/S2, no S3/S4, 2/6 SEM RUSB (can hear S2).  No peripheral edema.   Abdomen: Soft, nontender, no hepatosplenomegaly, no distention.  Skin: Intact without lesions or rashes.  Neurologic: Alert and oriented x 3.  Psych: Normal affect. Extremities: No clubbing or cyanosis.  HEENT: Normal.    Telemetry   NSR 100s (personally reviewed)  Labs    CBC Recent Labs    10/19/24 0215 10/20/24 0730  WBC 6.5 6.0  HGB 12.2 11.9*  HCT 37.2 37.1  MCV 89.4 90.3  PLT 211 180   Basic Metabolic Panel Recent Labs    98/68/73 0215 10/20/24 0730  NA 139 138  K 4.4 4.4  CL 102 103  CO2 26 23  GLUCOSE 81 147*  BUN 23 21  CREATININE 1.33* 1.23*  CALCIUM  9.1 8.7*  MG 1.7 1.6*   Liver Function Tests Recent Labs    10/19/24 0215 10/20/24 0730  AST 45* 176*  ALT 78* 186*  ALKPHOS 48 50  BILITOT 0.5 0.6  PROT 7.0 6.7  ALBUMIN 3.6 3.5   No results for input(s): LIPASE, AMYLASE in the last 72 hours. Cardiac Enzymes No results for input(s): CKTOTAL, CKMB, CKMBINDEX, TROPONINI in the last 72 hours.  BNP: BNP (last 3 results) No results for input(s): BNP in the last 8760 hours.  ProBNP (last 3 results) Recent Labs    10/19/24 1445  PROBNP 1,650.0*     D-Dimer Recent Labs    10/19/24 0215  DDIMER 18.49*   Hemoglobin A1C No results for input(s): HGBA1C in the last 72 hours. Fasting Lipid Panel No results for input(s): CHOL, HDL, LDLCALC, TRIG, CHOLHDL, LDLDIRECT in the last 72 hours. Medications:   Scheduled Medications:  atorvastatin   10 mg Oral Daily   Chlorhexidine   Gluconate Cloth  6 each Topical Daily   sodium chloride  flush  3 mL Intravenous Q12H    Infusions:  heparin  1,200 Units/hr (10/20/24 0700)    PRN Medications: acetaminophen  **OR** acetaminophen , mouth rinse, polyethylene glycol  Assessment/Plan   1. PE: Submassive PE with TnT 214, lactate 2.2, pro-BNP 1650.  BP stable, on 3L [place].  However, she has had recurrent syncope.  Echo showed mild RV enlargement/mild RV dilation, peak RV-RA gradient 23 mmHg. Currently hemodynamically stable but she is high risk with submassive PE + severe AS.  - Continue heparin  gtt.  - She is a [employer], probably best to avoid thrombolytics.  IR to see this morning, I think she needs mechanical thrombectomy.  - Keep preload generous  for now, has mild JVD so think reasonable at this time. Avoid diuresis.  2. Aortic stenosis: Severe AS, being monitored as outpatient. Echo this admission with EF 70-75%, mild LVH, severe AS mean gradient 38 mmHg, mild RV enlargement/mild RV dilation, peak RV-RA gradient 23 mmHg. Severe AS will make risk for hemodynamic decompensation higher.  - Aortic stenosis will need to be addressed after PE is treated.  3. CKD stage 3: Creatinine stable 1.23 today.   CRITICAL CARE Performed by: Ezra Shuck  Total critical care time: 35 minutes  Critical care time was exclusive of separately billable procedures and treating other patients.  Critical care was necessary to treat or prevent imminent or life-threatening deterioration.  Critical care was time spent personally by me on the following activities: development of treatment plan with patient and/or surrogate as well as nursing, discussions with consultants, evaluation of patient's response to treatment, examination of patient, obtaining history from patient or surrogate, ordering and performing treatments and interventions, ordering and review of laboratory studies, ordering and review of radiographic studies, pulse oximetry and re-evaluation of patient's condition.   Length of Stay: 2  Ezra Shuck, MD  10/20/2024, 8:37 AM  Advanced Heart Failure Team Pager [phone] (M-F; 7a - 5p)   Please visit [email or web address]: For overnight coverage please call cardiology fellow first. If fellow not available call Shock/ECMO MD on call.  For ECMO / Mechanical Support (Impella, IABP, LVAD) issues call Shock / ECMO MD on call.

## 2024-10-20 NOTE — Progress Notes (Signed)
 "  Rounding Note   Patient Name: Jeanette Martinez  Date of Encounter: 10/20/2024  [facility] Cardiologist: Alvan Ronal BRAVO, MD (Inactive)   Subjective Patient had another syncopal episode overnight.  Went suddenly unresponsive, did not lose pulse.  She was transferred to ICU.  BP 108/73 this morning.  Denies chest pain or dyspnea this morning   Scheduled Meds:  atenolol   25 mg Oral Daily   atorvastatin   10 mg Oral Daily   Chlorhexidine  Gluconate Cloth  6 each Topical Daily   sodium chloride  flush  3 mL Intravenous Q12H   Continuous Infusions:  heparin  1,200 Units/hr (10/20/24 0700)   PRN Meds: acetaminophen  **OR** acetaminophen , mouth rinse, polyethylene glycol   Vital Signs  Vitals:   10/20/24 0630 10/20/24 0645 10/20/24 0700 10/20/24 0734  BP:  (!) 85/66 108/73   Pulse: 94 96 (!) 101   Resp: (!) 21 18 19    Temp:    98 F (36.7 C)  TempSrc:    Oral  SpO2: 98% 99% 97%   Weight:      Height:        Intake/Output Summary (Last 24 hours) at 10/20/2024 0816 Last data filed at 10/20/2024 0700 Gross per 24 hour  Intake 2261.11 ml  Output 1550 ml  Net 711.11 ml      10/18/2024    9:00 PM 10/18/2024   11:45 AM 10/18/2024   10:26 AM  Last 3 Weights  Weight (lbs) 177 lb 6.4 oz 183 lb 8 oz 183 lb 8 oz  Weight (kg) 80.468 kg 83.235 kg 83.235 kg      Telemetry Normal sinus rhythm- Personally Reviewed  ECG  No new ECG- Personally Reviewed  Physical Exam  GEN: No acute distress.   Neck: No JVD Cardiac: RRR, 3 out of 6 systolic murmur Respiratory: Clear to auscultation bilaterally. GI: Soft, nontender, non-distended  MS: No edema; No deformity. Neuro:  Nonfocal  Psych: Normal affect   Labs High Sensitivity Troponin:  No results for input(s): TROPONINIHS in the last 720 hours.  Recent Labs  Lab 10/18/24 1204 10/18/24 1446 10/19/24 1445 10/19/24 1745  TRNPT 54* 50* 42* 36*       Chemistry Recent Labs  Lab 10/18/24 1204 10/19/24 0215  NA 136 139   K 4.8 4.4  CL 98 102  CO2 24 26  GLUCOSE 128* 81  BUN 21 23  CREATININE 1.25* 1.33*  CALCIUM  9.6 9.1  MG  --  1.7  PROT  --  7.0  ALBUMIN  --  3.6  AST  --  45*  ALT  --  78*  ALKPHOS  --  48  BILITOT  --  0.5  GFRNONAA 46* 43*  ANIONGAP 14 11    Lipids No results for input(s): CHOL, TRIG, HDL, LABVLDL, LDLCALC, CHOLHDL in the last 168 hours.  Hematology Recent Labs  Lab 10/18/24 1204 10/19/24 0215 10/20/24 0730  WBC 6.1 6.5 6.0  RBC 4.68 4.16 4.11  HGB 13.6 12.2 11.9*  HCT 42.5 37.2 37.1  MCV 90.8 89.4 90.3  MCH 29.1 29.3 29.0  MCHC 32.0 32.8 32.1  RDW 15.0 15.0 15.1  PLT 260 211 180   Thyroid No results for input(s): TSH, FREET4 in the last 168 hours.  BNP Recent Labs  Lab 10/19/24 1445  PROBNP 1,650.0*    DDimer  Recent Labs  Lab 10/19/24 0215  DDIMER 18.49*     Radiology  DG CHEST PORT 1 VIEW Result Date: 10/20/2024 EXAM: 1 VIEW(S)  XRAY OF THE CHEST 10/20/2024 06:03:00 AM COMPARISON: 10/18/2024 CLINICAL HISTORY: Dyspnea. FINDINGS: LINES, TUBES AND DEVICES: Left chest defibrillator pads noted. LUNGS AND PLEURA: No focal pulmonary opacity. No pleural effusion. No pneumothorax. HEART AND MEDIASTINUM: Cardiomegaly, unchanged. Atherosclerotic calcifications. BONES AND SOFT TISSUES: Scoliotic curvature of thoracolumbar spine. IMPRESSION: 1. No acute findings. 2. Cardiomegaly. Electronically signed by: Waddell Calk MD 10/20/2024 06:11 AM EST RP Workstation: [id number]   CT Angio Chest Pulmonary Embolism (PE) W or WO Contrast Result Date: 10/19/2024 CLINICAL DATA:  Dizziness and loss of consciousness. EXAM: CT ANGIOGRAPHY CHEST WITH CONTRAST TECHNIQUE: Multidetector CT imaging of the chest was performed using the standard protocol during bolus administration of intravenous contrast. Multiplanar CT image reconstructions and MIPs were obtained to evaluate the vascular anatomy. RADIATION DOSE REDUCTION: This exam was performed according to the  departmental dose-optimization program which includes automated exposure control, adjustment of the mA and/or kV according to patient size and/or use of iterative reconstruction technique. CONTRAST:  75mL OMNIPAQUE  IOHEXOL  350 MG/ML SOLN COMPARISON:  None Available. FINDINGS: Cardiovascular: Satisfactory opacification of the pulmonary arteries to the segmental level. An extensive amount of intraluminal low attenuation is seen within the distal aspects of the right and left pulmonary arteries, with extension to involve multiple bilateral upper lobe, right middle lobe and bilateral lower lobe branches of the pulmonary arteries. No saddle embolus is identified. There is mild cardiomegaly with diffuse left ventricular hypertrophy and severe right heart strain (RV/LV ratio of 2.18). No pericardial effusion. Mediastinum/Nodes: No enlarged mediastinal, hilar, or axillary lymph nodes. Thyroid gland, trachea, and esophagus demonstrate no significant findings. Lungs/Pleura: A 7 mm pulmonary nodule is seen within the posterior aspect of the right upper lobe (axial CT image 50, CT series 6). 4 mm and 5 mm pulmonary nodules are present within the posterolateral aspect of the right middle lobe (axial CT images 78 and 79, CT series 6). There is a 7 mm lingular pulmonary nodule (axial CT image 86, CT series 6). A 5 mm left lower lobe pulmonary nodule is also seen (axial CT image 65, CT series 6). A 6 mm pulmonary nodule is noted within the anterior aspect of the left upper lobe (axial CT image 42, CT series 6). Very areas of mild ground-glass appearing lung parenchyma are seen bilaterally. No pleural effusion or pneumothorax is identified. Upper Abdomen: There is a small to moderate sized hiatal hernia. Musculoskeletal: Marked severity dextroscoliosis of the thoracic spine is seen with multilevel degenerative changes noted. Review of the MIP images confirms the above findings. IMPRESSION: 1. Extensive bilateral pulmonary embolism  with severe right heart strain (RV/LV ratio of 2.18). 2. Multiple bilateral pulmonary nodules, as described above. Non-contrast chest CT at 3-6 months is recommended. If the nodules are stable at time of repeat CT, then future CT at 18-24 months (from today's scan) is considered optional for low-risk patients, but is recommended for high-risk patients. This recommendation follows the consensus statement: Guidelines for Management of Incidental Pulmonary Nodules Detected on CT Images: From the Fleischner Society 2017; Radiology 2017; 284:228-243. 3. Small to moderate sized hiatal hernia. 4. Marked severity dextroscoliosis of the thoracic spine with multilevel degenerative changes. Electronically Signed   By: Suzen Dials M.D.   On: 10/19/2024 13:05   ECHOCARDIOGRAM COMPLETE Result Date: 10/19/2024    ECHOCARDIOGRAM REPORT   Patient Name:   Elektra Gau  Date of Exam: 10/19/2024 Medical Rec #:  [id number]        Height:       65.0 in  Accession #:    [id number]       Weight:       177.4 lb Date of Birth:  [birth year]        BSA:          1.880 m Patient Age:    73 years         BP:           153/82 mmHg Patient Gender: F                HR:           83 bpm. Exam Location:  Inpatient Procedure: 2D Echo, Cardiac Doppler and Color Doppler (Both Spectral and Color            Flow Doppler were utilized during procedure). Indications:    Aortic stenosis I35.0  History:        Patient has prior history of Echocardiogram examinations, most                 recent 11/23/2023. Aortic Valve Disease.  Sonographer:    Jayson Gaskins Referring Phys: [id number] PETER C NISHAN IMPRESSIONS  1. Left ventricular ejection fraction, by estimation, is 70 to 75%. The left ventricle has hyperdynamic function. The left ventricle has no regional wall motion abnormalities. There is mild concentric left ventricular hypertrophy. Left ventricular diastolic parameters are consistent with Grade I diastolic dysfunction (impaired relaxation).  2. RV-RA  gradient 23 mmHg suggesting normal estimated RVSP if CVP normal. Right ventricular systolic function is mildly reduced. The right ventricular size is mildly enlarged.  3. Left atrial size was mildly dilated.  4. Right atrial size was mildly dilated.  5. The mitral valve is degenerative. Trivial mitral valve regurgitation.  6. The aortic valve is tricuspid. There is severe calcifcation of the aortic valve. Aortic valve regurgitation is trivial. Severe aortic valve stenosis. Aortic valve mean gradient measures 38.0 mmHg. Dimentionless index 0.27.  7. Unable to estimate CVP. Comparison(s): Prior images reviewed side by side. LVEF 70-75% with mild LVH and grade 1 diastolic dysfunction. Severe aortic stenosis with mean AV gradient up to 38 mmHg and DI 0.27. FINDINGS  Left Ventricle: Left ventricular ejection fraction, by estimation, is 70 to 75%. The left ventricle has hyperdynamic function. The left ventricle has no regional wall motion abnormalities. The left ventricular internal cavity size was normal in size. There is mild concentric left ventricular hypertrophy. Left ventricular diastolic parameters are consistent with Grade I diastolic dysfunction (impaired relaxation). Right Ventricle: RV-RA gradient 23 mmHg suggesting normal estimated RVSP if CVP normal. The right ventricular size is mildly enlarged. No increase in right ventricular wall thickness. Right ventricular systolic function is mildly reduced. Left Atrium: Left atrial size was mildly dilated. Right Atrium: Right atrial size was mildly dilated. Pericardium: There is no evidence of pericardial effusion. Mitral Valve: The mitral valve is degenerative in appearance. Trivial mitral valve regurgitation. MV peak gradient, 5.7 mmHg. The mean mitral valve gradient is 3.0 mmHg. Tricuspid Valve: The tricuspid valve is grossly normal. Tricuspid valve regurgitation is mild. Aortic Valve: The aortic valve is tricuspid. There is severe calcifcation of the aortic valve.  There is moderate aortic valve annular calcification. Aortic valve regurgitation is trivial. Severe aortic stenosis is present. Aortic valve mean gradient measures 38.0 mmHg. Aortic valve peak gradient measures 63.4 mmHg. Aortic valve area, by VTI measures 0.77 cm. Pulmonic Valve: The pulmonic valve was grossly normal. Pulmonic valve regurgitation is trivial. Aorta: The aortic root and ascending aorta  are structurally normal, with no evidence of dilitation. Venous: Unable to estimate CVP. The inferior vena cava was not well visualized. IAS/Shunts: No atrial level shunt detected by color flow Doppler. Additional Comments: 3D was performed not requiring image post processing on an independent workstation and was indeterminate.  LEFT VENTRICLE PLAX 2D LVIDd:         3.50 cm   Diastology LVIDs:         2.10 cm   LV e' medial:    5.00 cm/s LV PW:         1.10 cm   LV E/e' medial:  13.2 LV IVS:        1.00 cm   LV e' lateral:   6.64 cm/s LVOT diam:     1.90 cm   LV E/e' lateral: 9.9 LV SV:         65 LV SV Index:   34 LVOT Area:     2.84 cm  RIGHT VENTRICLE RV S prime:     10.40 cm/s TAPSE (M-mode): 2.9 cm LEFT ATRIUM             Index        RIGHT ATRIUM           Index LA Vol (A2C):   53.9 ml 28.67 ml/m  RA Area:     20.10 cm LA Vol (A4C):   70.6 ml 37.56 ml/m  RA Volume:   57.70 ml  30.69 ml/m LA Biplane Vol: 62.2 ml 33.09 ml/m  AORTIC VALVE AV Area (Vmax):    0.73 cm AV Area (Vmean):   0.77 cm AV Area (VTI):     0.77 cm AV Vmax:           398.00 cm/s AV Vmean:          287.000 cm/s AV VTI:            0.842 m AV Peak Grad:      63.4 mmHg AV Mean Grad:      38.0 mmHg LVOT Vmax:         102.00 cm/s LVOT Vmean:        78.400 cm/s LVOT VTI:          0.228 m LVOT/AV VTI ratio: 0.27  AORTA Ao Root diam: 2.40 cm MITRAL VALVE                TRICUSPID VALVE MV Area (PHT): 3.51 cm     TR Peak grad:   23.0 mmHg MV Area VTI:   2.27 cm     TR Vmax:        240.00 cm/s MV Peak grad:  5.7 mmHg MV Mean grad:  3.0 mmHg      SHUNTS MV Vmax:       1.19 m/s     Systemic VTI:  0.23 m MV Vmean:      84.7 cm/s    Systemic Diam: 1.90 cm MV Decel Time: 216 msec MV E velocity: 66.00 cm/s MV A velocity: 113.00 cm/s MV E/A ratio:  0.58 Jayson Sierras MD Electronically signed by Jayson Sierras MD Signature Date/Time: 10/19/2024/11:58:34 AM    Final    DG Chest 2 View Result Date: 10/18/2024 CLINICAL DATA:  Chest pain. EXAM: CHEST - 2 VIEW COMPARISON:  None Available. FINDINGS: S-shaped scoliosis in the thoracolumbar spine. Lungs are clear. Heart size is within normal limits. Trachea is midline. No large pleural effusions. Negative for a pneumothorax. IMPRESSION: 1. No active cardiopulmonary  disease. 2. Scoliosis. Electronically Signed   By: Juliene Balder M.D.   On: 10/18/2024 12:57    Cardiac Studies   Patient Profile   74 y.o. female with severe aortic stenosis who presents with episodes of syncope.  Assessment & Plan   Submassive PE: presented with exertional syncope, initially suspected to be due to her severe aortic stenosis but found on admission to have elevated D-dimer, and CTPA showed submassive PE.  Mildly reduced RV function on echo.  Started on heparin  drip and PCCM and IR consulted, initial plan was for anticoagulation and consider aspiration thrombectomy or catheter directed lytics if she deteriorates. - Given syncopal episode at rest overnight, I think intervention is warranted with either aspiration thrombectomy or catheter directed lytics.  She is already preload dependent with her severe aortic stenosis and since the submassive PE will decrease her preload, she has no reserve and I worry could be headed towards hemodynamic collapse if no intervention is done. Discussed with PCCM and IR  Severe aortic stenosis: Echocardiogram 11/23/2023 showed EF 65 to 70%, severe aortic stenosis.  This was being monitored as outpatient as she denied symptoms.  Echo this admission confirms severe aortic stenosis.  Initially thought to  be the cause of her presentation with syncope, however subsequently found to have submassive PE.  Certainly her severe aortic stenosis is affecting her hemodynamics as above but PE is the more pressing issue and would defer further workup of her aortic stenosis for now  Hypertension: Hold home antihypertensives  CKD stage IIIa: Creatinine stable at 1.2 this morning   For questions or updates, please contact [facility] Please consult [email or web address] for contact info under   Signed, Lonni LITTIE Nanas, MD  10/20/2024, 8:16 AM    "

## 2024-10-20 NOTE — Progress Notes (Signed)
 SPIRITUAL CARE AND COUNSELING CONSULT NOTE   VISIT SUMMARY Chaplain saw and spoke with Aliany's daughter in the waiting area and followed up on her mother's code last night. Romonia's daughter said more family members were on the way to the hospital. Chaplain reminded daughter of Chaplain services, when needed.   SPIRITUAL ENCOUNTER                                                                                                                                                                      Type of Visit: Follow up Care provided to:: Family Conversation partners present during encounter: Nurse Referral source: Nurse (RN/NT/LPN) Reason for visit: Code OnCall Visit: Yes   INTERVENTIONS   Spiritual Care Interventions Made: Established relationship of care and support, Compassionate presence    INTERVENTION OUTCOMES   Outcomes: Connection to spiritual care, Awareness of support, Awareness around self/spiritual resourses   If immediate needs arise, please contact [address] 24 hour on call [phone]   Roxanne Slade  10/20/2024 7:57 PM

## 2024-10-20 NOTE — Sedation Documentation (Signed)
 ACT 148, the results given to Dr. Philip. Ordered 5000 units of heparin  to given.

## 2024-10-21 ENCOUNTER — Other Ambulatory Visit (HOSPITAL_COMMUNITY): Payer: Self-pay

## 2024-10-21 ENCOUNTER — Inpatient Hospital Stay (HOSPITAL_COMMUNITY)

## 2024-10-21 ENCOUNTER — Encounter (HOSPITAL_COMMUNITY): Payer: Self-pay | Admitting: Internal Medicine

## 2024-10-21 ENCOUNTER — Encounter (HOSPITAL_COMMUNITY): Admission: EM | Disposition: A | Payer: Self-pay | Source: Home / Self Care | Attending: Internal Medicine

## 2024-10-21 ENCOUNTER — Telehealth (HOSPITAL_COMMUNITY): Payer: Self-pay | Admitting: Pharmacy Technician

## 2024-10-21 DIAGNOSIS — R911 Solitary pulmonary nodule: Secondary | ICD-10-CM | POA: Diagnosis not present

## 2024-10-21 DIAGNOSIS — N183 Chronic kidney disease, stage 3 unspecified: Secondary | ICD-10-CM | POA: Diagnosis not present

## 2024-10-21 DIAGNOSIS — R55 Syncope and collapse: Secondary | ICD-10-CM | POA: Diagnosis not present

## 2024-10-21 DIAGNOSIS — I2699 Other pulmonary embolism without acute cor pulmonale: Secondary | ICD-10-CM | POA: Diagnosis not present

## 2024-10-21 DIAGNOSIS — I129 Hypertensive chronic kidney disease with stage 1 through stage 4 chronic kidney disease, or unspecified chronic kidney disease: Secondary | ICD-10-CM | POA: Diagnosis not present

## 2024-10-21 DIAGNOSIS — I350 Nonrheumatic aortic (valve) stenosis: Secondary | ICD-10-CM | POA: Diagnosis not present

## 2024-10-21 LAB — BASIC METABOLIC PANEL WITH GFR
Anion gap: 12 (ref 5–15)
BUN: 17 mg/dL (ref 8–23)
CO2: 23 mmol/L (ref 22–32)
Calcium: 8.2 mg/dL — ABNORMAL LOW (ref 8.9–10.3)
Chloride: 102 mmol/L (ref 98–111)
Creatinine, Ser: 1.09 mg/dL — ABNORMAL HIGH (ref 0.44–1.00)
GFR, Estimated: 54 mL/min — ABNORMAL LOW
Glucose, Bld: 102 mg/dL — ABNORMAL HIGH (ref 70–99)
Potassium: 4.5 mmol/L (ref 3.5–5.1)
Sodium: 137 mmol/L (ref 135–145)

## 2024-10-21 LAB — CBC
HCT: 33.2 % — ABNORMAL LOW (ref 36.0–46.0)
Hemoglobin: 10.9 g/dL — ABNORMAL LOW (ref 12.0–15.0)
MCH: 29.5 pg (ref 26.0–34.0)
MCHC: 32.8 g/dL (ref 30.0–36.0)
MCV: 90 fL (ref 80.0–100.0)
Platelets: 185 10*3/uL (ref 150–400)
RBC: 3.69 MIL/uL — ABNORMAL LOW (ref 3.87–5.11)
RDW: 15 % (ref 11.5–15.5)
WBC: 8.2 10*3/uL (ref 4.0–10.5)
nRBC: 0 % (ref 0.0–0.2)

## 2024-10-21 LAB — GLUCOSE, CAPILLARY: Glucose-Capillary: 132 mg/dL — ABNORMAL HIGH (ref 70–99)

## 2024-10-21 LAB — HEPARIN LEVEL (UNFRACTIONATED): Heparin Unfractionated: 0.84 [IU]/mL — ABNORMAL HIGH (ref 0.30–0.70)

## 2024-10-21 LAB — MAGNESIUM: Magnesium: 2.2 mg/dL (ref 1.7–2.4)

## 2024-10-21 MED ORDER — APIXABAN 5 MG PO TABS
10.0000 mg | ORAL_TABLET | Freq: Two times a day (BID) | ORAL | Status: DC
  Administered 2024-10-21 – 2024-10-22 (×3): 10 mg via ORAL
  Filled 2024-10-21 (×3): qty 2

## 2024-10-21 MED ORDER — APIXABAN 5 MG PO TABS: 5.0000 mg | ORAL_TABLET | Freq: Two times a day (BID) | ORAL | Status: DC

## 2024-10-21 MED ORDER — ONDANSETRON HCL 4 MG/2ML IJ SOLN
4.0000 mg | Freq: Three times a day (TID) | INTRAMUSCULAR | Status: DC | PRN
  Administered 2024-10-21: 4 mg via INTRAVENOUS
  Filled 2024-10-21: qty 2

## 2024-10-21 MED ORDER — IOHEXOL 350 MG/ML SOLN
75.0000 mL | Freq: Once | INTRAVENOUS | Status: AC | PRN
  Administered 2024-10-21: 75 mL via INTRAVENOUS

## 2024-10-21 MED ORDER — ATENOLOL 25 MG PO TABS
25.0000 mg | ORAL_TABLET | Freq: Every day | ORAL | Status: DC
  Administered 2024-10-21 – 2024-10-22 (×2): 25 mg via ORAL
  Filled 2024-10-21 (×2): qty 1

## 2024-10-21 MED ORDER — IOHEXOL 9 MG/ML PO SOLN
500.0000 mL | ORAL | Status: AC
  Administered 2024-10-21 (×2): 500 mL via ORAL

## 2024-10-21 NOTE — Telephone Encounter (Signed)
 Patient Product/process Development Scientist completed.    The patient is insured through [employer] [place] [employer] . Patient has [employer], may use a copay card, and/or apply for patient assistance if available.    Ran test claim for Eliquis  5 mg and the current 30 day co-pay is $51.74.   This test claim was processed through [facility]- copay amounts may vary at other pharmacies due to pharmacy/plan contracts, or as the patient moves through the different stages of their insurance plan.     Jeanette Martinez, CPHT Pharmacy Technician Patient Advocate Specialist [facility] Patient Advocate Team Direct Number: [phone]  Fax: [phone]

## 2024-10-21 NOTE — Progress Notes (Signed)
 PHARMACY - ANTICOAGULATION CONSULT NOTE  Pharmacy Consult for heparin   Indication: pulmonary embolus  Allergies[1]  Patient Measurements: Height: 5' 5 (165.1 cm) Weight: 80.5 kg (177 lb 6.4 oz) IBW/kg (Calculated) : 57 HEPARIN  DW (KG): 74  Vital Signs: Temp: 98.1 F (36.7 C) (02/01 2320) Temp Source: Oral (02/01 2320) BP: 111/65 (02/02 0300) Pulse Rate: 70 (02/02 0300)  Labs: Recent Labs    10/19/24 0215 10/19/24 2206 10/20/24 0730 10/21/24 0146  HGB 12.2  --  11.9* 10.9*  HCT 37.2  --  37.1 33.2*  PLT 211  --  180 185  HEPARINUNFRC  --  0.52 0.65 0.84*  CREATININE 1.33*  --  1.23* 1.09*    Estimated Creatinine Clearance: 49.6 mL/min (A) (by C-G formula based on SCr of 1.09 mg/dL (H)).  Assessment: 74 yo female here with syncope and CT shows PE w/ severe right heart strain and pharmacy consulted to dose IV heparin . No anticoagulation noted PTA.  -heparin  level 0.65 > 0.84 (supra-therapeutic) on 1200 units/hr s/p mechanical thrombectomy (received additional 5000 units during thrombectomy @1030  on 2/1) -CBC stable -No issues per RN  Goal of Therapy:  Heparin  level 0.3-0.7 units/ml Monitor platelets by anticoagulation protocol: Yes   Plan:  -Decrease heparin  gtt to 1100 units/hr -8h heparin  level -Heparin  level  daily wth CBC daily  Rutha Poplar, PharmD, BCPS Clinical Pharmacist 10/21/2024 3:10 AM        [1] No Known Allergies

## 2024-10-22 ENCOUNTER — Other Ambulatory Visit (HOSPITAL_COMMUNITY): Payer: Self-pay

## 2024-10-22 ENCOUNTER — Telehealth: Payer: Self-pay | Admitting: Pulmonary Disease

## 2024-10-22 DIAGNOSIS — I82401 Acute embolism and thrombosis of unspecified deep veins of right lower extremity: Secondary | ICD-10-CM

## 2024-10-22 LAB — LIPID PANEL
Cholesterol: 149 mg/dL (ref 0–200)
HDL: 31 mg/dL — ABNORMAL LOW
LDL Cholesterol: 88 mg/dL (ref 0–99)
Total CHOL/HDL Ratio: 4.8 ratio
Triglycerides: 150 mg/dL — ABNORMAL HIGH
VLDL: 30 mg/dL (ref 0–40)

## 2024-10-22 LAB — HEMOGLOBIN A1C
Hgb A1c MFr Bld: 6 % — ABNORMAL HIGH (ref 4.8–5.6)
Mean Plasma Glucose: 125.5 mg/dL

## 2024-10-22 LAB — BASIC METABOLIC PANEL WITH GFR
Anion gap: 9 (ref 5–15)
BUN: 16 mg/dL (ref 8–23)
CO2: 26 mmol/L (ref 22–32)
Calcium: 8.7 mg/dL — ABNORMAL LOW (ref 8.9–10.3)
Chloride: 103 mmol/L (ref 98–111)
Creatinine, Ser: 1.24 mg/dL — ABNORMAL HIGH (ref 0.44–1.00)
GFR, Estimated: 46 mL/min — ABNORMAL LOW
Glucose, Bld: 95 mg/dL (ref 70–99)
Potassium: 4.9 mmol/L (ref 3.5–5.1)
Sodium: 138 mmol/L (ref 135–145)

## 2024-10-22 LAB — CBC
HCT: 33 % — ABNORMAL LOW (ref 36.0–46.0)
Hemoglobin: 10.5 g/dL — ABNORMAL LOW (ref 12.0–15.0)
MCH: 29.2 pg (ref 26.0–34.0)
MCHC: 31.8 g/dL (ref 30.0–36.0)
MCV: 91.7 fL (ref 80.0–100.0)
Platelets: 203 10*3/uL (ref 150–400)
RBC: 3.6 MIL/uL — ABNORMAL LOW (ref 3.87–5.11)
RDW: 15.1 % (ref 11.5–15.5)
WBC: 6 10*3/uL (ref 4.0–10.5)
nRBC: 0 % (ref 0.0–0.2)

## 2024-10-22 LAB — MAGNESIUM: Magnesium: 2.1 mg/dL (ref 1.7–2.4)

## 2024-10-22 LAB — PRO BRAIN NATRIURETIC PEPTIDE: Pro Brain Natriuretic Peptide: 1407 pg/mL — ABNORMAL HIGH

## 2024-10-22 MED ORDER — APIXABAN 5 MG PO TABS
ORAL_TABLET | ORAL | 0 refills | Status: AC
  Filled 2024-10-22: qty 72, 30d supply, fill #0

## 2024-10-22 NOTE — Assessment & Plan Note (Addendum)
 10/22/24 had submassive PE and went for mechanical thrombectomy. Now on Eliquis  BID.

## 2024-10-22 NOTE — Progress Notes (Signed)
 Physical Therapy Quick Note  PT has completed initial evaluation.    Overall, patient at mod I assistance level.   PT Follow up recommended: No follow up PT Equipment recommended:  None recommended Complete evaluation note to follow.     Arlyss Gandy, PT, DPT Acute Rehabilitation Office [phone]

## 2024-10-31 ENCOUNTER — Inpatient Hospital Stay: Admitting: Student in an Organized Health Care Education/Training Program

## 2025-01-06 ENCOUNTER — Ambulatory Visit: Admitting: Cardiovascular Disease

## 2025-02-06 ENCOUNTER — Inpatient Hospital Stay: Admitting: Internal Medicine
